# Patient Record
Sex: Female | Born: 1975 | Race: White | Hispanic: No | Smoking: Current every day smoker
Health system: Southern US, Community
[De-identification: ages and names within clinical notes are randomized; demographics above are authoritative.]

## PROBLEM LIST (undated history)

## (undated) DIAGNOSIS — K219 Gastro-esophageal reflux disease without esophagitis: Secondary | ICD-10-CM

## (undated) DIAGNOSIS — F1910 Other psychoactive substance abuse, uncomplicated: Secondary | ICD-10-CM

## (undated) HISTORY — PX: HAND SURGERY: SHX662

## (undated) HISTORY — PX: CHOLECYSTECTOMY: SHX55

## (undated) HISTORY — PX: APPENDECTOMY: SHX54

---

## 2001-02-15 ENCOUNTER — Encounter: Payer: Self-pay | Admitting: Emergency Medicine

## 2001-02-15 ENCOUNTER — Emergency Department (HOSPITAL_COMMUNITY): Admission: EM | Admit: 2001-02-15 | Discharge: 2001-02-16 | Payer: Self-pay | Admitting: Emergency Medicine

## 2001-02-20 ENCOUNTER — Emergency Department (HOSPITAL_COMMUNITY): Admission: EM | Admit: 2001-02-20 | Discharge: 2001-02-20 | Payer: Self-pay | Admitting: Emergency Medicine

## 2001-02-20 ENCOUNTER — Encounter: Payer: Self-pay | Admitting: Emergency Medicine

## 2001-03-11 ENCOUNTER — Emergency Department (HOSPITAL_COMMUNITY): Admission: EM | Admit: 2001-03-11 | Discharge: 2001-03-12 | Payer: Self-pay

## 2001-03-11 ENCOUNTER — Encounter: Payer: Self-pay | Admitting: Emergency Medicine

## 2001-11-20 ENCOUNTER — Emergency Department (HOSPITAL_COMMUNITY): Admission: EM | Admit: 2001-11-20 | Discharge: 2001-11-21 | Payer: Self-pay | Admitting: *Deleted

## 2001-11-29 ENCOUNTER — Emergency Department (HOSPITAL_COMMUNITY): Admission: EM | Admit: 2001-11-29 | Discharge: 2001-11-29 | Payer: Self-pay | Admitting: Emergency Medicine

## 2001-11-29 ENCOUNTER — Encounter: Payer: Self-pay | Admitting: Emergency Medicine

## 2001-11-30 ENCOUNTER — Emergency Department (HOSPITAL_COMMUNITY): Admission: EM | Admit: 2001-11-30 | Discharge: 2001-11-30 | Payer: Self-pay | Admitting: Emergency Medicine

## 2003-06-11 ENCOUNTER — Emergency Department (HOSPITAL_COMMUNITY): Admission: EM | Admit: 2003-06-11 | Discharge: 2003-06-11 | Payer: Self-pay

## 2003-07-11 ENCOUNTER — Emergency Department (HOSPITAL_COMMUNITY): Admission: EM | Admit: 2003-07-11 | Discharge: 2003-07-11 | Payer: Self-pay | Admitting: Emergency Medicine

## 2003-12-15 ENCOUNTER — Emergency Department (HOSPITAL_COMMUNITY): Admission: EM | Admit: 2003-12-15 | Discharge: 2003-12-15 | Payer: Self-pay | Admitting: Emergency Medicine

## 2003-12-31 ENCOUNTER — Emergency Department (HOSPITAL_COMMUNITY): Admission: EM | Admit: 2003-12-31 | Discharge: 2004-01-01 | Payer: Self-pay | Admitting: Emergency Medicine

## 2004-12-15 ENCOUNTER — Emergency Department: Payer: Self-pay | Admitting: Emergency Medicine

## 2010-04-24 ENCOUNTER — Other Ambulatory Visit: Payer: Self-pay | Admitting: Obstetrics and Gynecology

## 2010-04-24 DIAGNOSIS — N63 Unspecified lump in unspecified breast: Secondary | ICD-10-CM

## 2010-05-14 ENCOUNTER — Ambulatory Visit
Admission: RE | Admit: 2010-05-14 | Discharge: 2010-05-14 | Disposition: A | Discharge status: Home / Self Care | Payer: Self-pay | Source: Ambulatory Visit | Attending: Obstetrics and Gynecology | Admitting: Obstetrics and Gynecology

## 2010-05-14 ENCOUNTER — Other Ambulatory Visit: Payer: Self-pay | Admitting: Obstetrics and Gynecology

## 2010-05-14 DIAGNOSIS — N63 Unspecified lump in unspecified breast: Secondary | ICD-10-CM

## 2017-01-06 ENCOUNTER — Encounter (HOSPITAL_BASED_OUTPATIENT_CLINIC_OR_DEPARTMENT_OTHER): Payer: Self-pay | Admitting: Emergency Medicine

## 2017-01-06 ENCOUNTER — Emergency Department (HOSPITAL_BASED_OUTPATIENT_CLINIC_OR_DEPARTMENT_OTHER): Payer: BLUE CROSS/BLUE SHIELD

## 2017-01-06 ENCOUNTER — Other Ambulatory Visit: Payer: Self-pay

## 2017-01-06 ENCOUNTER — Emergency Department (HOSPITAL_BASED_OUTPATIENT_CLINIC_OR_DEPARTMENT_OTHER)
Admission: EM | Admit: 2017-01-06 | Discharge: 2017-01-07 | Disposition: A | Discharge status: Home / Self Care | Payer: BLUE CROSS/BLUE SHIELD | Attending: Emergency Medicine | Admitting: Emergency Medicine

## 2017-01-06 DIAGNOSIS — R079 Chest pain, unspecified: Secondary | ICD-10-CM | POA: Insufficient documentation

## 2017-01-06 DIAGNOSIS — R1084 Generalized abdominal pain: Secondary | ICD-10-CM | POA: Diagnosis not present

## 2017-01-06 DIAGNOSIS — K2900 Acute gastritis without bleeding: Secondary | ICD-10-CM | POA: Diagnosis not present

## 2017-01-06 DIAGNOSIS — N72 Inflammatory disease of cervix uteri: Secondary | ICD-10-CM | POA: Insufficient documentation

## 2017-01-06 DIAGNOSIS — Z9049 Acquired absence of other specified parts of digestive tract: Secondary | ICD-10-CM | POA: Diagnosis not present

## 2017-01-06 DIAGNOSIS — Z79899 Other long term (current) drug therapy: Secondary | ICD-10-CM | POA: Insufficient documentation

## 2017-01-06 DIAGNOSIS — F17210 Nicotine dependence, cigarettes, uncomplicated: Secondary | ICD-10-CM | POA: Insufficient documentation

## 2017-01-06 DIAGNOSIS — R0602 Shortness of breath: Secondary | ICD-10-CM | POA: Diagnosis present

## 2017-01-06 DIAGNOSIS — R102 Pelvic and perineal pain: Secondary | ICD-10-CM

## 2017-01-06 HISTORY — DX: Other psychoactive substance abuse, uncomplicated: F19.10

## 2017-01-06 HISTORY — DX: Gastro-esophageal reflux disease without esophagitis: K21.9

## 2017-01-06 LAB — COMPREHENSIVE METABOLIC PANEL
ALT: 14 U/L (ref 14–54)
AST: 18 U/L (ref 15–41)
Albumin: 3.4 g/dL — ABNORMAL LOW (ref 3.5–5.0)
Alkaline Phosphatase: 49 U/L (ref 38–126)
Anion gap: 7 (ref 5–15)
BUN: 14 mg/dL (ref 6–20)
CO2: 25 mmol/L (ref 22–32)
Calcium: 8.6 mg/dL — ABNORMAL LOW (ref 8.9–10.3)
Chloride: 101 mmol/L (ref 101–111)
Creatinine, Ser: 0.66 mg/dL (ref 0.44–1.00)
GFR calc Af Amer: >60 mL/min (ref >60)
GFR calc non Af Amer: >60 mL/min (ref >60)
Glucose, Bld: 97 mg/dL (ref 65–99)
Potassium: 3.2 mmol/L — ABNORMAL LOW (ref 3.5–5.1)
Sodium: 133 mmol/L — ABNORMAL LOW (ref 135–145)
Total Bilirubin: 0.4 mg/dL (ref 0.3–1.2)
Total Protein: 6.4 g/dL — ABNORMAL LOW (ref 6.5–8.1)

## 2017-01-06 LAB — URINALYSIS, ROUTINE W REFLEX MICROSCOPIC
Bilirubin Urine: NEGATIVE
Glucose, UA: NEGATIVE mg/dL
Hgb urine dipstick: NEGATIVE
Ketones, ur: NEGATIVE mg/dL
Leukocytes, UA: NEGATIVE
Nitrite: NEGATIVE
Protein, ur: NEGATIVE mg/dL
Specific Gravity, Urine: 1.025 (ref 1.005–1.030)
pH: 6 (ref 5.0–8.0)

## 2017-01-06 LAB — RAPID URINE DRUG SCREEN, HOSP PERFORMED
Amphetamines: NOT DETECTED
Barbiturates: NOT DETECTED
Benzodiazepines: NOT DETECTED
Cocaine: NOT DETECTED
Opiates: NOT DETECTED
Tetrahydrocannabinol: NOT DETECTED

## 2017-01-06 LAB — CBC WITH DIFFERENTIAL/PLATELET
Basophils Absolute: 0 10*3/uL (ref 0.0–0.1)
Basophils Relative: 0 %
Eosinophils Absolute: 0 10*3/uL (ref 0.0–0.7)
Eosinophils Relative: 0 %
HCT: 31.1 % — ABNORMAL LOW (ref 36.0–46.0)
Hemoglobin: 10 g/dL — ABNORMAL LOW (ref 12.0–15.0)
Lymphocytes Relative: 8 %
Lymphs Abs: 1 10*3/uL (ref 0.7–4.0)
MCH: 27.2 pg (ref 26.0–34.0)
MCHC: 32.2 g/dL (ref 30.0–36.0)
MCV: 84.7 fL (ref 78.0–100.0)
Monocytes Absolute: 0.6 10*3/uL (ref 0.1–1.0)
Monocytes Relative: 5 %
Neutro Abs: 11 10*3/uL — ABNORMAL HIGH (ref 1.7–7.7)
Neutrophils Relative %: 87 %
Platelets: 446 10*3/uL — ABNORMAL HIGH (ref 150–400)
RBC: 3.67 MIL/uL — ABNORMAL LOW (ref 3.87–5.11)
RDW: 14.9 % (ref 11.5–15.5)
WBC: 12.7 10*3/uL — ABNORMAL HIGH (ref 4.0–10.5)

## 2017-01-06 LAB — WET PREP, GENITAL
Clue Cells Wet Prep HPF POC: NONE SEEN
Sperm: NONE SEEN
Trich, Wet Prep: NONE SEEN
Yeast Wet Prep HPF POC: NONE SEEN

## 2017-01-06 LAB — PREGNANCY, URINE: Preg Test, Ur: NEGATIVE

## 2017-01-06 LAB — LIPASE, BLOOD: Lipase: 24 U/L (ref 11–51)

## 2017-01-06 MED ORDER — DOXYCYCLINE HYCLATE 100 MG PO CAPS: 100.0000 mg | ORAL_CAPSULE | Freq: Two times a day (BID) | ORAL | 0 refills | Status: AC

## 2017-01-06 MED ORDER — POLYETHYLENE GLYCOL 3350 17 G PO PACK: 17.0000 g | PACK | Freq: Every day | ORAL | 0 refills | Status: AC

## 2017-01-06 MED ORDER — PANTOPRAZOLE SODIUM 20 MG PO TBEC: 20.0000 mg | DELAYED_RELEASE_TABLET | Freq: Every day | ORAL | 0 refills | Status: AC

## 2017-01-06 MED ORDER — PANTOPRAZOLE SODIUM 40 MG IV SOLR
40.0000 mg | Freq: Once | INTRAVENOUS | Status: AC
  Administered 2017-01-06: 40 mg via INTRAVENOUS
  Filled 2017-01-06: qty 40

## 2017-01-06 MED ORDER — KETOROLAC TROMETHAMINE 30 MG/ML IJ SOLN
30.0000 mg | Freq: Once | INTRAMUSCULAR | Status: AC
  Administered 2017-01-06: 30 mg via INTRAVENOUS
  Filled 2017-01-06: qty 1

## 2017-01-06 MED ORDER — ACETAMINOPHEN 500 MG PO TABS: 1000.0000 mg | ORAL_TABLET | Freq: Four times a day (QID) | ORAL | 0 refills | Status: DC | PRN

## 2017-01-06 MED ORDER — TRAMADOL HCL 50 MG PO TABS
100.0000 mg | ORAL_TABLET | Freq: Once | ORAL | Status: AC
  Administered 2017-01-06: 100 mg via ORAL
  Filled 2017-01-06: qty 2

## 2017-01-06 MED ORDER — SODIUM CHLORIDE 0.9 % IV SOLN
Freq: Once | INTRAVENOUS | Status: AC
  Administered 2017-01-06: 19:00:00 via INTRAVENOUS

## 2017-01-06 MED ORDER — DOXYCYCLINE HYCLATE 100 MG PO TABS
100.0000 mg | ORAL_TABLET | Freq: Once | ORAL | Status: AC
  Administered 2017-01-06: 100 mg via ORAL
  Filled 2017-01-06: qty 1

## 2017-01-06 MED ORDER — IOPAMIDOL (ISOVUE-370) INJECTION 76%
100.0000 mL | Freq: Once | INTRAVENOUS | Status: AC | PRN
  Administered 2017-01-06: 100 mL via INTRAVENOUS

## 2017-01-06 MED ORDER — TRAMADOL HCL 50 MG PO TABS: 100.0000 mg | ORAL_TABLET | Freq: Four times a day (QID) | ORAL | 0 refills | Status: DC | PRN

## 2017-01-06 NOTE — ED Notes (Signed)
Pt claimed that she has pain to the middle of her shoulder blade that shoot to her bilateral ribcage.  She is a smoker.

## 2017-01-06 NOTE — ED Notes (Signed)
ED Provider at bedside. 

## 2017-01-06 NOTE — Discharge Instructions (Signed)
1.  Schedule recheck with your gynecologist and your family doctor this week. 2.  Start all medications as prescribed. 3.  Return if you develop fever, vomiting or other worsening symptoms.

## 2017-01-06 NOTE — ED Provider Notes (Signed)
[facility] EMERGENCY DEPARTMENT Provider Note   CSN: [id number] Arrival date & time: 01/06/17  1757     History   Chief Complaint Chief Complaint  Patient presents with  . Shortness of Breath    HPI Jessica Wolf is a 41 y.o. female.  HPI Reports she is having severe pain in her epigastrium and central back behind the shoulder blades.  She reports its both burning and sharp in nature.  It has been constant.  She reports she feels short of breath.  Worse with deep inspiration or exertion.  She denies she has a fever.  She reports that she has taken frequent Goody powders in the past and is concerned for possible ulcer.  Also she had bronchitis and took a prednisone Dosepak and albuterol.  She reports she had a chest x-ray done and was seen at the [facility] emergency department today.  Reports he gave her Toradol (which helped) and, reportedly told "there is nothing we can do for you".  Patient went home and her mother was still concerned for her and brought her to the urgent care who advised her to be seen at the emergency department.  Patient has had both prior appendectomy and cholecystectomy.  No known history of PE. Past Medical History:  Diagnosis Date  . Drug abuse (HCC)   . GERD (gastroesophageal reflux disease)     There are no active problems to display for this patient.   Past Surgical History:  Procedure Laterality Date  . APPENDECTOMY    . CHOLECYSTECTOMY    . HAND SURGERY Right     OB History    No data available       Home Medications    Prior to Admission medications   Medication Sig Start Date End Date Taking? Authorizing Provider  buprenorphine-naloxone (SUBOXONE) 2-0.5 mg SUBL SL tablet Place 1 tablet under the tongue daily.   Yes [provider]  acetaminophen (TYLENOL) 500 MG tablet Take 2 tablets (1,000 mg total) by mouth every 6 (six) hours as needed. 01/06/17   Arby BarrettePfeiffer, Amorina Doerr, MD  doxycycline (VIBRAMYCIN) 100 MG capsule Take  1 capsule (100 mg total) by mouth 2 (two) times daily. One po bid x 7 days 01/06/17   Arby BarrettePfeiffer, Tunis Gentle, MD  pantoprazole (PROTONIX) 20 MG tablet Take 1 tablet (20 mg total) by mouth daily. 01/06/17   Arby BarrettePfeiffer, Beckem Tomberlin, MD  polyethylene glycol (MIRALAX / GLYCOLAX) packet Take 17 g by mouth daily. 01/06/17   Arby BarrettePfeiffer, Danne Vasek, MD  traMADol (ULTRAM) 50 MG tablet Take 2 tablets (100 mg total) by mouth every 6 (six) hours as needed. 01/06/17   Arby BarrettePfeiffer, Zyhir Cappella, MD    Family History No family history on file.  Social History Social History   Tobacco Use  . Smoking status: Current Every Day Smoker    Packs/day: 0.50    Types: Cigarettes  . Smokeless tobacco: Never Used  Substance Use Topics  . Alcohol use: No    Frequency: Never  . Drug use: No     Allergies   Penicillins and Morphine and related   Review of Systems Review of Systems 10 Systems reviewed and are negative for acute change except as noted in the HPI.   Physical Exam Updated Vital Signs BP 116/71 (BP Location: Left Arm)   Pulse (!) 102   Temp 99.7 F (37.6 C) (Oral)   Resp (!) 22   Ht 5\' 5"  (1.651 m)   Wt 52.2 kg (115 lb)   LMP 12/30/2016  SpO2 95%   BMI 19.14 kg/m   Physical Exam  Constitutional: She is oriented to person, place, and time. She appears well-developed and well-nourished. No distress.  Patient appears uncomfortable.  She is alert and nontoxic.  No respiratory distress at rest.  HENT:  Head: Normocephalic and atraumatic.  Nose: Nose normal.  Mouth/Throat: Oropharynx is clear and moist.  Eyes: Conjunctivae and EOM are normal. Pupils are equal, round, and reactive to light.  Neck: Neck supple.  Cardiovascular: Regular rhythm.  No murmur heard. Borderline tachycardia.  No rub murmur gallop.  Pulmonary/Chest: Effort normal and breath sounds normal. No stridor. No respiratory distress. She has no wheezes.  Abdominal: Soft. She exhibits no distension. There is tenderness.  Patient endorses  significant tenderness in the lower abdomen as well as upper abdomen.  No peritoneal signs.  Genitourinary:  Genitourinary Comments: External female genitalia.  Speculum moderate white mucousy discharge in vault.  Cervix is not friable.  No bleeding.  Bimanual exam for moderate to severe uterine tenderness.  Bilateral adnexa are tender.  Musculoskeletal: Normal range of motion. She exhibits no edema or tenderness.  Neurological: She is alert and oriented to person, place, and time. No cranial nerve deficit. She exhibits normal muscle tone. Coordination normal.  Skin: Skin is warm and dry.  Psychiatric: She has a normal mood and affect.  Nursing note and vitals reviewed.    ED Treatments / Results  Labs (all labs ordered are listed, but only abnormal results are displayed) Labs Reviewed  WET PREP, GENITAL - Abnormal; Notable for the following components:      Result Value   WBC, Wet Prep HPF POC MODERATE (*)    All other components within normal limits  COMPREHENSIVE METABOLIC PANEL - Abnormal; Notable for the following components:   Sodium 133 (*)    Potassium 3.2 (*)    Calcium 8.6 (*)    Total Protein 6.4 (*)    Albumin 3.4 (*)    All other components within normal limits  CBC WITH DIFFERENTIAL/PLATELET - Abnormal; Notable for the following components:   WBC 12.7 (*)    RBC 3.67 (*)    Hemoglobin 10.0 (*)    HCT 31.1 (*)    Platelets 446 (*)    Neutro Abs 11.0 (*)    All other components within normal limits  URINALYSIS, ROUTINE W REFLEX MICROSCOPIC - Abnormal; Notable for the following components:   APPearance CLOUDY (*)    All other components within normal limits  LIPASE, BLOOD  RAPID URINE DRUG SCREEN, HOSP PERFORMED  PREGNANCY, URINE  RPR  HIV ANTIBODY (ROUTINE TESTING)  GC/CHLAMYDIA PROBE AMP ([place]) NOT AT [facility]    EKG  EKG Interpretation  Date/Time:  Monday January 06 2017 18:10:20 EST Ventricular Rate:  102 PR Interval:  86 QRS Duration: 90 QT  Interval:  302 QTC Calculation: 393 R Axis:   73 Text Interpretation:  Sinus tachycardia with short PR Otherwise normal ECG agree. no old Confirmed by Arby BarrettePfeiffer, Lequita Meadowcroft [phone]([id number]) on 01/06/2017 8:20:48 PM       Radiology Ct Angio Chest Pe W/cm &/or Wo Cm  Result Date: 01/06/2017 CLINICAL DATA:  Chest pain and shortness of breath.  Abdominal pain. EXAM: CT ANGIOGRAPHY CHEST CT ABDOMEN AND PELVIS WITH CONTRAST TECHNIQUE: Multidetector CT imaging of the chest was performed using the standard protocol during bolus administration of intravenous contrast. Multiplanar CT image reconstructions and MIPs were obtained to evaluate the vascular anatomy. Multidetector CT imaging of the abdomen and  pelvis was performed using the standard protocol during bolus administration of intravenous contrast. CONTRAST:  ISOVUE-370 IOPAMIDOL (ISOVUE-370) INJECTION 76% COMPARISON:  Abdominal CT 10/09/2010 FINDINGS: CTA CHEST FINDINGS Cardiovascular: There are no filling defects within the pulmonary arteries to suggest pulmonary embolus. Normal caliber thoracic aorta without dissection. The heart is normal in size. No pericardial fluid. Mediastinum/Nodes: No mediastinal or hilar adenopathy. The esophagus is decompressed. Visualized thyroid gland is normal. Lungs/Pleura: No consolidation. Minimal linear atelectasis in both lower lobes. Mild central bronchial thickening. Heterogeneity of lung parenchyma suggesting small airways disease. No pulmonary edema or pleural fluid. Trachea and non stem bronchi are patent. Musculoskeletal: There are no acute or suspicious osseous abnormalities. Review of the MIP images confirms the above findings. CT ABDOMEN and PELVIS FINDINGS Hepatobiliary: No focal hepatic lesion. Postcholecystectomy with mild intrahepatic biliary ductal dilatation. Normal postcholecystectomy common bile duct of 7 mm. Pancreas: No ductal dilatation or inflammation. Spleen: Normal in size without focal abnormality.  Adrenals/Urinary Tract: Adrenal glands are unremarkable. Kidneys are normal, without renal calculi, focal lesion, or hydronephrosis. Bladder is unremarkable. Stomach/Bowel: Stomach is within normal limits. Appendix is not visualized, surgically absent per report. No evidence of bowel wall thickening, distention, or inflammatory changes. Moderate colonic stool throughout the colon. Vascular/Lymphatic: Mild aortic atherosclerosis, advanced for age. Circumaortic left renal vein. No abdominal or pelvic adenopathy. Reproductive: Uterus unremarkable. No adnexal mass. Prominent left adnexal vascularity and dilatation of the ovarian vein measuring 8 mm. Other: Small amount of free fluid in the pelvis, both pericolic gutters and both upper quadrants, minimally complex in the right upper quadrant. No free air. No intra-abdominal abscess. Musculoskeletal: There are no acute or suspicious osseous abnormalities. Review of the MIP images confirms the above findings. IMPRESSION: CT CHEST IMPRESSION: 1. No pulmonary embolus. 2. Bronchial thickening with heterogeneous parenchymal attenuation suggesting bronchial inflammation and small vessel disease. CT ABDOMEN/PELVIS IMPRESSION: 1. Small volume of free fluid in the pelvis, both pericolic gutters and upper abdomen, right greater than left, of indeterminate etiology. Ruptured ovarian cyst with tracking fluid could have this appearance. 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 2. Mild aortic atherosclerosis, age advanced. 3. Moderate colonic stool burden, suggesting constipation. Electronically Signed   By: Rubye Oaks M.D.   On: 01/06/2017 22:07   Ct Abdomen Pelvis W Contrast  Result Date: 01/06/2017 CLINICAL DATA:  Chest pain and shortness of breath.  Abdominal pain. EXAM: CT ANGIOGRAPHY CHEST CT ABDOMEN AND PELVIS WITH CONTRAST TECHNIQUE: Multidetector CT imaging of the chest was performed using the  standard protocol during bolus administration of intravenous contrast. Multiplanar CT image reconstructions and MIPs were obtained to evaluate the vascular anatomy. Multidetector CT imaging of the abdomen and pelvis was performed using the standard protocol during bolus administration of intravenous contrast. CONTRAST:  ISOVUE-370 IOPAMIDOL (ISOVUE-370) INJECTION 76% COMPARISON:  Abdominal CT 10/09/2010 FINDINGS: CTA CHEST FINDINGS Cardiovascular: There are no filling defects within the pulmonary arteries to suggest pulmonary embolus. Normal caliber thoracic aorta without dissection. The heart is normal in size. No pericardial fluid. Mediastinum/Nodes: No mediastinal or hilar adenopathy. The esophagus is decompressed. Visualized thyroid gland is normal. Lungs/Pleura: No consolidation. Minimal linear atelectasis in both lower lobes. Mild central bronchial thickening. Heterogeneity of lung parenchyma suggesting small airways disease. No pulmonary edema or pleural fluid. Trachea and non stem bronchi are patent. Musculoskeletal: There are no acute or suspicious osseous abnormalities. Review of the MIP images confirms the above findings. CT ABDOMEN and PELVIS FINDINGS  Hepatobiliary: No focal hepatic lesion. Postcholecystectomy with mild intrahepatic biliary ductal dilatation. Normal postcholecystectomy common bile duct of 7 mm. Pancreas: No ductal dilatation or inflammation. Spleen: Normal in size without focal abnormality. Adrenals/Urinary Tract: Adrenal glands are unremarkable. Kidneys are normal, without renal calculi, focal lesion, or hydronephrosis. Bladder is unremarkable. Stomach/Bowel: Stomach is within normal limits. Appendix is not visualized, surgically absent per report. No evidence of bowel wall thickening, distention, or inflammatory changes. Moderate colonic stool throughout the colon. Vascular/Lymphatic: Mild aortic atherosclerosis, advanced for age. Circumaortic left renal vein. No abdominal or  pelvic adenopathy. Reproductive: Uterus unremarkable. No adnexal mass. Prominent left adnexal vascularity and dilatation of the ovarian vein measuring 8 mm. Other: Small amount of free fluid in the pelvis, both pericolic gutters and both upper quadrants, minimally complex in the right upper quadrant. No free air. No intra-abdominal abscess. Musculoskeletal: There are no acute or suspicious osseous abnormalities. Review of the MIP images confirms the above findings. IMPRESSION: CT CHEST IMPRESSION: 1. No pulmonary embolus. 2. Bronchial thickening with heterogeneous parenchymal attenuation suggesting bronchial inflammation and small vessel disease. CT ABDOMEN/PELVIS IMPRESSION: 1. Small volume of free fluid in the pelvis, both pericolic gutters and upper abdomen, right greater than left, of indeterminate etiology. Ruptured ovarian cyst with tracking fluid could have this appearance. 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 2. Mild aortic atherosclerosis, age advanced. 3. Moderate colonic stool burden, suggesting constipation. Electronically Signed   By: Rubye Oaks M.D.   On: 01/06/2017 22:07    Procedures Procedures (including critical care time)  Medications Ordered in ED Medications  pantoprazole (PROTONIX) injection 40 mg (40 mg Intravenous Given 01/06/17 1923)  0.9 %  sodium chloride infusion ( Intravenous New Bag/Given 01/06/17 1923)  iopamidol (ISOVUE-370) 76 % injection 100 mL (100 mLs Intravenous Contrast Given 01/06/17 2057)  ketorolac (TORADOL) 30 MG/ML injection 30 mg (30 mg Intravenous Given 01/06/17 2225)  traMADol (ULTRAM) tablet 100 mg (100 mg Oral Given 01/06/17 2318)  doxycycline (VIBRA-TABS) tablet 100 mg (100 mg Oral Given 01/06/17 2317)     Initial Impression / Assessment and Plan / ED Course  I have reviewed the triage vital signs and the nursing notes.  Pertinent labs & imaging results that were  available during my care of the patient were reviewed by me and considered in my medical decision making (see chart for details).     Final Clinical Impressions(s) / ED Diagnoses   Final diagnoses:  Generalized abdominal pain  Chest pain, unspecified type  Pelvic pain in female  Acute gastritis without hemorrhage, unspecified gastritis type  Cervicitis  Patient's pain is quite diffuse and intense.  Pain goes from epigastrium and mid chest to pelvic area.  CT scan has not identified specific etiology.  Patient does have some pelvic fluid and very tender pelvic exam.  She does not have significant discharge.  Denies sexual activity for several years or more.  At this time will treat empirically with doxycycline.  Patient reports significant penicillin allergy. Also patient identifies epigastric pain and frequent use of aspirin with concern for ulcer.  CT does not show gastric wall thickening or free air.  Patient is advised to start daily Protonix and discontinue aspirin or NSAIDs. CT also identifies constipation.  Patient will be started on MiraLAX.  Patient does report distant history of opioid use but advises at this time that she does not use any opioids and does not want to use them for pain control.  Tramadol prescribed for pain control with Tylenol to be used as needed. Patient counseled on return for fever, worsening symptoms or other concerns.   ED Discharge Orders        Ordered    doxycycline (VIBRAMYCIN) 100 MG capsule  2 times daily     01/06/17 2324    pantoprazole (PROTONIX) 20 MG tablet  Daily     01/06/17 2324    polyethylene glycol (MIRALAX / GLYCOLAX) packet  Daily     01/06/17 2324    traMADol (ULTRAM) 50 MG tablet  Every 6 hours PRN     01/06/17 2324    acetaminophen (TYLENOL) 500 MG tablet  Every 6 hours PRN     01/06/17 2324       Arby Barrette, MD 01/06/17 2338

## 2017-01-06 NOTE — ED Triage Notes (Addendum)
DX with bronchitis x 10 days ago, took prednisone dose pack and albuterol inhaler . Pain to chest and back with inspiration and worsening SOB. Was eval at ED in [place] this am and was told,"There is nothing we can do for you" Given cough med and had a chest axay done . RT in to eval in triage

## 2017-01-08 LAB — GC/CHLAMYDIA PROBE AMP (~~LOC~~) NOT AT ARMC
Chlamydia: NEGATIVE
Neisseria Gonorrhea: NEGATIVE

## 2017-01-08 LAB — HIV ANTIBODY (ROUTINE TESTING W REFLEX): HIV SCREEN 4TH GENERATION: NONREACTIVE

## 2017-01-08 LAB — RPR: RPR Ser Ql: NONREACTIVE

## 2018-07-11 IMAGING — CT CT ANGIO CHEST
3 of 12 series · 13 of 37 positions shown · IV contrast (iopamidol)
Comparison: Abdominal CT 10/09/2010

CLINICAL DATA: Chest pain and shortness of breath.  Abdominal pain.

EXAM:
CT ANGIOGRAPHY CHEST
CT ABDOMEN AND PELVIS WITH CONTRAST
TECHNIQUE: Multidetector CT imaging of the chest was performed using the
standard protocol during bolus administration of intravenous
contrast. Multiplanar CT image reconstructions and MIPs were
obtained to evaluate the vascular anatomy. Multidetector CT imaging
of the abdomen and pelvis was performed using the standard protocol
during bolus administration of intravenous contrast.
CONTRAST:  100mL 3D93ST-F4V IOPAMIDOL (3D93ST-F4V) INJECTION 76%

[Series 6: pe axial st · axial · 0.65mm/px · z∈[-117,-33]mm · 2 of 85 slices shown]
[im 29/85  lung]
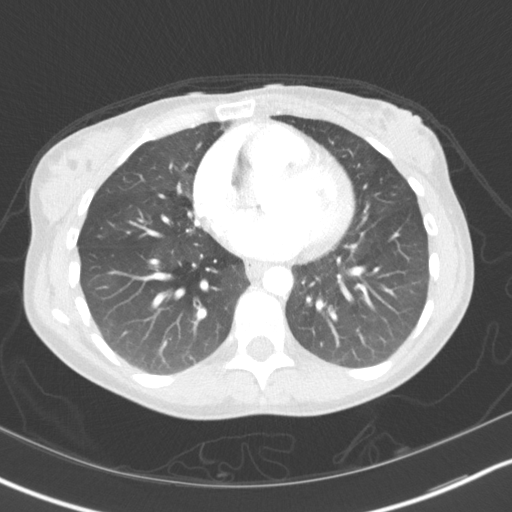
[im 57/85  lung]
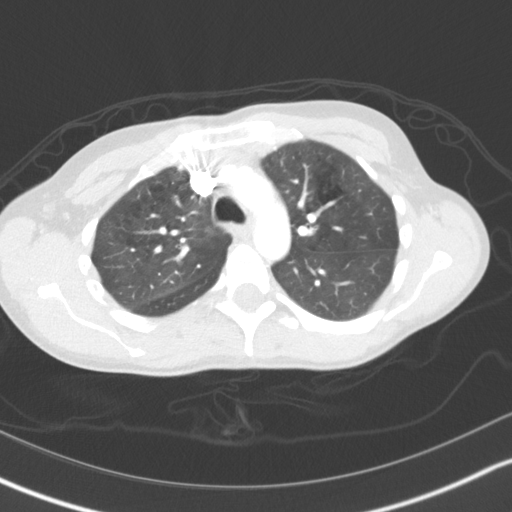

[Series 8: pe thins · axial · 0.65mm/px · z∈[-185,+34]mm · 8 of 253 slices shown]
[im 17/253  lung]
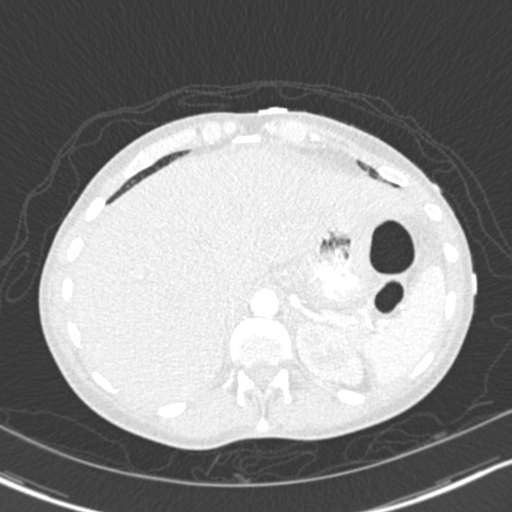
[im 51/253  lung]
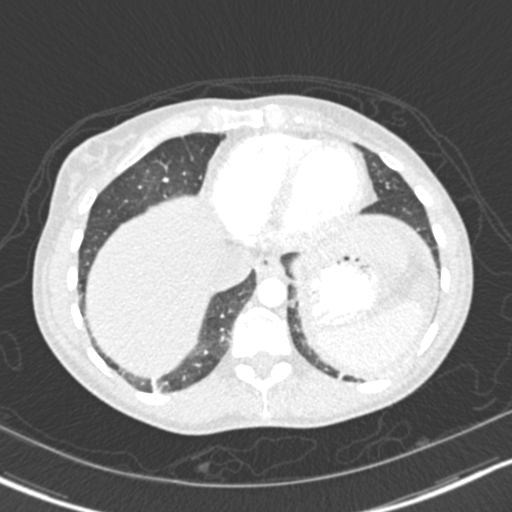
[im 85/253  lung]
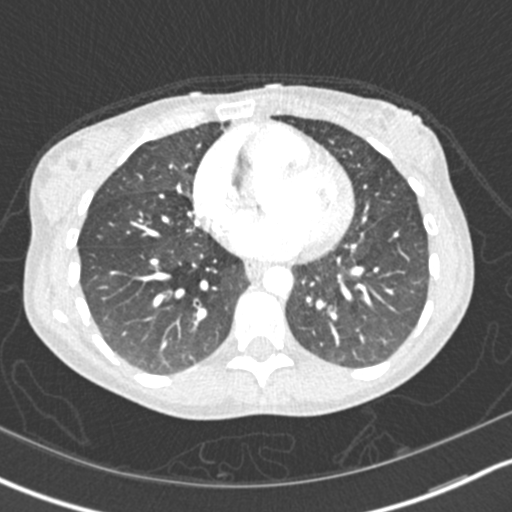
[im 118/253  lung]
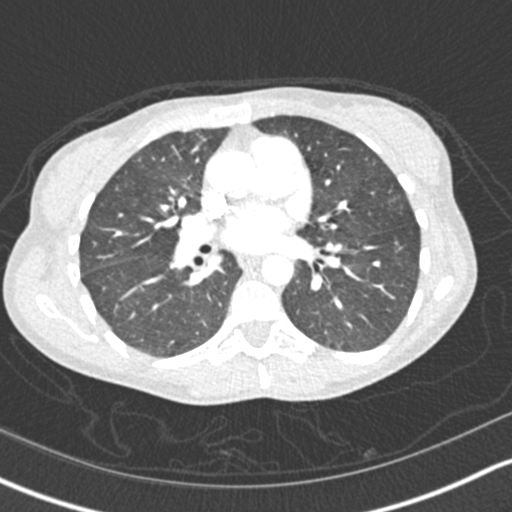
[im 135/253  lung]
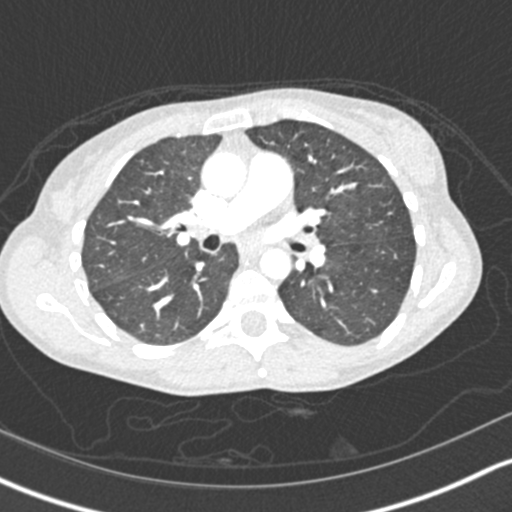
[im 169/253  lung]
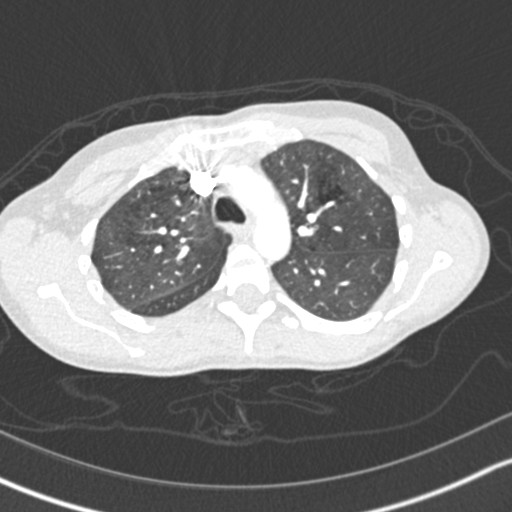
[im 202/253  lung]
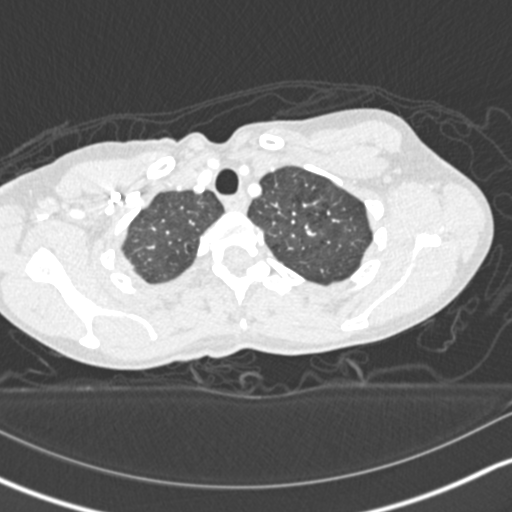
[im 236/253  lung]
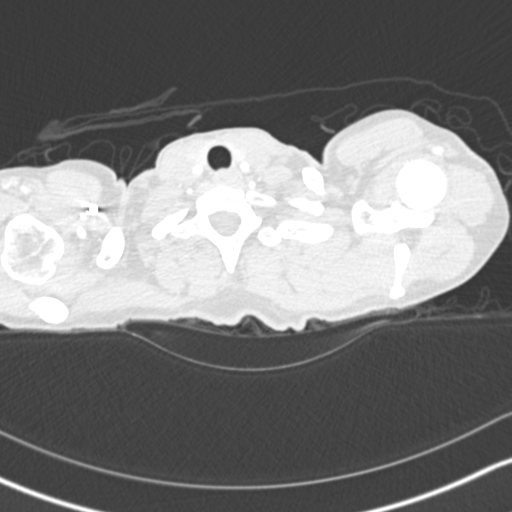

[Series 13: axial st · axial · 0.74mm/px · z∈[-451,-231]mm · 3 of 88 slices shown]
[im 22/88  lung]
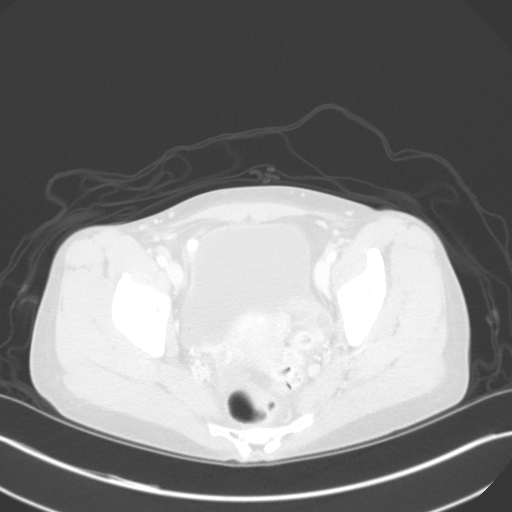
[im 44/88  mediastinal]
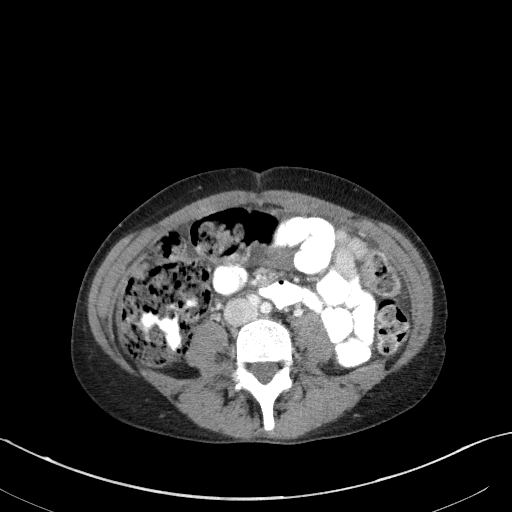
[im 66/88  lung]
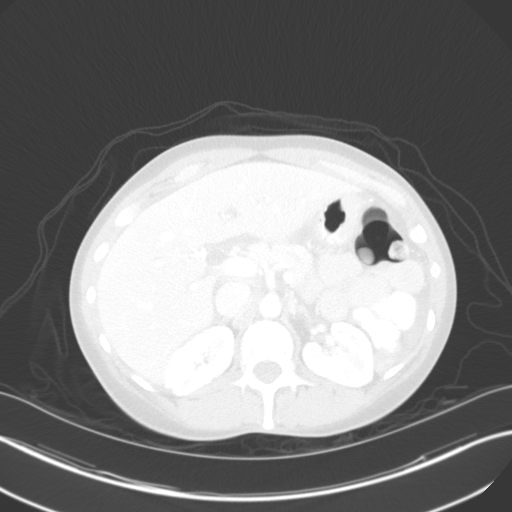

[13 of 37 positions shown; findings below may reference images not displayed]

FINDINGS: CTA CHEST FINDINGS

Cardiovascular: There are no filling defects within the pulmonary
arteries to suggest pulmonary embolus. Normal caliber thoracic aorta
without dissection. The heart is normal in size. No pericardial
fluid.

Mediastinum/Nodes: No mediastinal or hilar adenopathy. The esophagus
is decompressed. Visualized thyroid gland is normal.

Lungs/Pleura: No consolidation. Minimal linear atelectasis in both
lower lobes. Mild central bronchial thickening. Heterogeneity of
lung parenchyma suggesting small airways disease. No pulmonary edema
or pleural fluid. Trachea and non stem bronchi are patent.

Musculoskeletal: There are no acute or suspicious osseous
abnormalities.

Review of the MIP images confirms the above findings.

CT ABDOMEN and PELVIS FINDINGS

Hepatobiliary: No focal hepatic lesion. Postcholecystectomy with
mild intrahepatic biliary ductal dilatation. Normal
postcholecystectomy common bile duct of 7 mm.

Pancreas: No ductal dilatation or inflammation.

Spleen: Normal in size without focal abnormality.

Adrenals/Urinary Tract: Adrenal glands are unremarkable. Kidneys are
normal, without renal calculi, focal lesion, or hydronephrosis.
Bladder is unremarkable.

Stomach/Bowel: Stomach is within normal limits. Appendix is not
visualized, surgically absent per report. No evidence of bowel wall
thickening, distention, or inflammatory changes. Moderate colonic
stool throughout the colon.

Vascular/Lymphatic: Mild aortic atherosclerosis, advanced for age.
Circumaortic left renal vein. No abdominal or pelvic adenopathy.

Reproductive: Uterus unremarkable. No adnexal mass. Prominent left
adnexal vascularity and dilatation of the ovarian vein measuring 8
mm.

Other: Small amount of free fluid in the pelvis, both pericolic
gutters and both upper quadrants, minimally complex in the right
upper quadrant. No free air. No intra-abdominal abscess.

Musculoskeletal: There are no acute or suspicious osseous
abnormalities.

Review of the MIP images confirms the above findings.
IMPRESSION: CT CHEST IMPRESSION:

1. No pulmonary embolus.
2. Bronchial thickening with heterogeneous parenchymal attenuation
suggesting bronchial inflammation and small vessel disease.
CT ABDOMEN/PELVIS IMPRESSION:

1. Small volume of free fluid in the pelvis, both pericolic gutters
and upper abdomen, right greater than left, of indeterminate
etiology. Ruptured ovarian cyst with tracking fluid could have this
appearance. Possible pelvic congestion syndrome with prominent left
peri-uterine vascularity dilatation of the ovarian vein.
2. Mild aortic atherosclerosis, age advanced.
3. Moderate colonic stool burden, suggesting constipation.

## 2020-06-06 DIAGNOSIS — J189 Pneumonia, unspecified organism: Secondary | ICD-10-CM | POA: Insufficient documentation

## 2020-06-06 DIAGNOSIS — J441 Chronic obstructive pulmonary disease with (acute) exacerbation: Secondary | ICD-10-CM | POA: Insufficient documentation

## 2020-06-09 NOTE — Discharge Summary (Signed)
 [facility] Hospitalist Discharge Summary  Identifying Information:  Jessica Wolf 08/17/1975 [id number]  Admit date: 06/06/2020  Discharge date: 06/09/2020   Discharge Service: [facility] Hospitalist  Discharge Attending Physician:Dileep Von, MD  Discharge to: Home  Discharge Diagnoses: Principal Problem:   CAP (community acquired pneumonia) Active Problems:   Respiratory failure with hypoxia (HCC)   COPD exacerbation (HCC) Resolved Problems:   * No resolved hospital problems. [facility] Course:  Jessica Wolf a 45 y.o.femalewith PMHx COPD, opiate abuse on Suboxone, cigarette smokingas reviewed in the EMR that presented to [facility] withshortness of breath for the past 4 days and feeling chills. Patient went to urgent care today where she was found to have acute hypoxic respiratory failure and shewas sent to ER. In the ED patient was found to have 86% on room air, short of breath, tachypneic and tachycardic, CTA ruled out PE, found to have bilateral pneumonia. So patient was admitted for further management as below # Acute hypoxic respiratory failure due to COPD exacerbation secondary to pneumonia.  Patient was started on Breo Ellipta and DuoNeb every 6 hourly, patient required supplemental O2 inhalation 2 L at rest and 4 L while ablation.  Oxygen was arranged on discharge.  Patient was seen by pulmonologist, recommended to follow-up as an outpatient for PFTs and further management.  Patient was discharged on albuterol and Symbicort inhaler. # Rhinovirus infection, possible bacterial superinfection/ CAP possible bacterial superinfection. WBC count trended down to normal. S/p ceftriaxone and azithromycin IV, transition to oral antibiotics Omnicef 300 twice daily for 3 additional days.  Patient was advised to continue Mucinex twice daily and Robitussin-DM as needed.  Blood culture negative, COVID-19 negative, influenza A and B negative.  RVP positive for rhinovirus.  # Sinus tachycardia could be  secondary to hypoxic respiratory failure. D-dimer 610 elevated, CTA negative for PE. TTE shows LVEF 55 to 60%, mild to moderate pulmonary hypertension, TSH level 0.36, free T4 1.0, FT3 level 2.44 within normal range.  Follow with PCP to repeat thyroid profile after 4 weeks. Tachycardia resolved, heart rate within normal range # Hypokalemia, potassium elevated. # Hypomagnesemia, mag repleted. # Hypophosphatemia, phosphorus repleted. # Nicotine dependence, patient smokes cigarettes daily. Smoking cessation counseling done, started nicotine patch # History of opiate abuse, denies any drug abuse for the past 8 years Resumed Suboxone, UDS negative # Vitamin D deficiency, started vitamin D 50,000 units p.o. weekly.  Follow-up with PCP to repeat vitamin D level after 3 months. # iron deficiency, iron saturation 3%, started oral iron supplement.  Follow with PCP to repeat iron profile after 3 months. # Vitamin B12 deficiency, started vitamin B12 1000 mcg IM injection daily during hospital stay, start oral supplement on discharge.  Follow with PCP to repeat vitamin B12 level after 3 months.  Overall patient's condition remained stable, medically optimized and cleared for discharge home today.  Patient agreed with the discharge planning.    Post Discharge Follow Up Issues:  Follow-up with PCP in 1 week Follow with pulmonologist in 1 to 2 week for PFTs.     Procedures: No admission procedures for hospital encounter. _____________________________________________________________________________ Discharge Day Services: BP 107/77   Pulse 88   Temp 97.6 F (36.4 C) (Oral)   Resp 18   Ht 1.651 m (5' 5)   Wt 53.6 kg (118 lb 1 oz)   LMP 06/03/2020   SpO2 95%   BMI 19.65 kg/m  Pt seen on the day of discharge and determined appropriate for discharge. GEN: NAD,  lying in bed EYES: EOMI ENT: MMM CV: RRR PULM: Mild crackles bilaterally, no significant wheezing appreciated ABD: soft, NT/ND, +BS EXT:  No edema   Condition at Discharge: good  Length of Discharge: I spent 45 mins in the discharge of this patient. _____________________________________________________________________________ Discharge Medications: Patient Instructions:  Discharge Medication List as of 06/09/2020  1:17 PM    START taking these medications   Details  ascorbic acid, vitamin C, (VITAMIN C) 500 MG tablet Take 1 tablet (500 mg total) by mouth daily., Starting Sat 06/10/2020, Until Fri 09/08/2020, Normal    budesonide-formoteroL (SYMBICORT) 80-4.5 mcg/actuation inhaler Inhale 2 puffs into the lungs 2 times daily for 30 days., Starting Fri 06/09/2020, Until Sun 07/09/2020, Normal    cefdinir (OMNICEF) 300 MG capsule Take 1 capsule (300 mg total) by mouth 2 times daily for 3 days., Starting Fri 06/09/2020, Until Mon 06/12/2020, Normal    cyanocobalamin (VITAMIN B12) 1000 MCG tablet Take 1 tablet (1,000 mcg total) by mouth daily., Starting Fri 06/09/2020, Until Thu 09/07/2020, Normal    dextromethorphan-guaiFENesin (ROBITUSSIN DM) 10-100 mg/5 mL syrup Take 5 mLs by mouth every 6 (six) hours as needed., Starting Fri 06/09/2020, Normal    ergocalciferol (VITAMIN D2) 1,250 mcg (50,000 unit) capsule Take 1 capsule (50,000 Units total) by mouth once a week for 7 doses., Starting Wed 06/14/2020, Until Thu 07/27/2020, Normal    guaiFENesin (MUCINEX) 600 mg 12 hr tablet Take 1 tablet (600 mg total) by mouth every 12 hours for 5 days., Starting Fri 06/09/2020, Until Wed 06/14/2020, Normal    iron polysaccharides (NU-IRON) 150 mg iron capsule Take 1 capsule (150 mg total) by mouth daily., Starting Sat 06/10/2020, Until Fri 09/08/2020, Normal    nicotine (NICODERM CQ) 21 mg/24 hr Place 1 patch onto the skin daily., Starting Sat 06/10/2020, Normal      CONTINUE these medications which have CHANGED   Details  albuterol 90 mcg/actuation inhaler Inhale 2 puffs into the lungs every 6 (six) hours as needed for up to 30 days for Wheezing or  Shortness of Breath., Starting Fri 06/09/2020, Until Sun 07/09/2020 at 2359, Normal      CONTINUE these medications which have NOT CHANGED   Details  buprenorphine-naloxone (ZUBSOLV) 5.7-1.4 mg SL tablet Place 1 tablet under the tongue 3 times daily. (Place and dissolve under tongue), Historical Med    omeprazole (PRILOSEC) 40 MG capsule Take 40 mg by mouth daily., Historical Med      STOP taking these medications     sucralfate (CARAFATE) 1 gram tablet      fluticasone furoate-vilanteroL (BREO ELLIPTA) 100-25 mcg/dose inhaler          _____________________________________________________________________________ Pending Test Results (if blank, then none): @PRINTGROUPPENDLAB @  Most Recent Labs:  Lab Results  Component Value Date/Time   WBC 6.2 06/09/2020 0154   RBC 4.30 06/09/2020 0154   HGB 9.1 (L) 06/09/2020 0154   HCT 29.7 (L) 06/09/2020 0154   PLT 542 (H) 06/09/2020 0154    Lab Results  Component Value Date   WBC 6.2 06/09/2020   HGB 9.1 (L) 06/09/2020   HCT 29.7 (L) 06/09/2020   PLT 542 (H) 06/09/2020    Lab Results  Component Value Date   CO2 25 06/09/2020   BUN 6 (L) 06/09/2020   CREATININE 0.58 06/09/2020   CALCIUM 8.2 (L) 06/09/2020   ALBUMIN 3.9 06/06/2020   AST 14 06/06/2020   ALT 12 06/06/2020    Lab Results  Component Value Date   NA 139  06/09/2020   K 4.1 06/09/2020   CL 108 06/09/2020   CO2 25 06/09/2020   BUN 6 (L) 06/09/2020   CREATININE 0.58 06/09/2020   CALCIUM 8.2 (L) 06/09/2020   MG 1.9 06/09/2020   PHOS 3.3 06/09/2020    Lab Results  Component Value Date   ALKPHOS 70 06/06/2020   BILITOT 0.4 06/06/2020   PROT 7.8 06/06/2020   ALBUMIN 3.9 06/06/2020   ALT 12 06/06/2020   AST 14 06/06/2020    No results found for: INR, APTT Hospital Radiology:  CUS TTE SURFACE COMPLETE ECHO ADULT  Result Date: 06/07/2020                                [facility]                                Cardiac Ultrasound-                                 [place]                                [address]                                [place] [address]                   Transthoracic Echocardiogram Report Name: [place], [place]                   Study Date: 06/07/2020    Height: 65 in MRN: [id number]                         Patient Location: [facility] Weight: 111 lb DOB: Sep 25, 1975                      Gender: Female             BSA: 1.5 m2 Age: 55 yrs                          Ethnicity: W               BP: 124/75 mmHg Reason For Study: Arrhythmia; Respiratory distress; Hypoxia     HR: 80 History: Arrhythmia Ordering Physician: VON BELLIS    Performed By: MERRI BOOKBINDER Referring Physician: SELF, A REFERRAL - - PROCEDURE A two-dimensional transthoracic echocardiogram with color flow and  Doppler was performed. Image Quality: Technically adequate. - SUMMARY Left ventricular systolic function is normal. LV ejection fraction = 55-60%. There is mild mitral regurgitation. There is mild tricuspid regurgitation. Mild to moderate pulmonary hypertension. There is no pericardial effusion. There is no comparison study available. - FINDINGS: LEFT VENTRICLE The left ventricular size is normal. There is normal left ventricular wall thickness. Left ventricular systolic function is normal. LV ejection fraction = 55-60%. Left ventricular filling pattern is normal. The left ventricular wall motion is normal. - RIGHT VENTRICLE The right ventricle is normal in size and function. LEFT ATRIUM The left atrial size is normal. RIGHT ATRIUM Right atrial size is normal. - AORTIC VALVE The aortic valve is trileaflet. There is mild aortic valve thickening. There is no aortic stenosis. There is no aortic regurgitation. - MITRAL VALVE Diffuse thickening of the mitral leaflet with preserved opening. There is mild mitral  regurgitation. - TRICUSPID VALVE The tricuspid valve is thickened with normal excursion. There is mild tricuspid regurgitation. Estimated right ventricular systolic pressure is 52 mmHg. Mild to moderate pulmonary hypertension. A tricuspid valve vegetation cannot be excluded. - PULMONIC VALVE The pulmonic valve is normal in structure and function. - ARTERIES The aortic sinus is normal size. - VENOUS The IVC is _ dialted with an abnormal collapsibility index, this suggestive of increased right atrial pressure. - EFFUSION There is no pericardial effusion. There is a pleural effusion present. - - MMode/2D Measurements & Calculations IVSd: 0.91 cm      LA dim: 3.2 cm     ESV(MOD-sp4):LVOT diam: 1.8 cm LVIDd: 3.4 cm      EDV(MOD-sp4):      17.8 ml LVPWd: 0.97 cm     39.2 ml            EDV(MOD-sp2): LVIDs: 2.3 cm                         56.1 ml                                       ESV(MOD-sp2):                                       17.5 ml       _______________________________________________________________________ SV(MOD-sp4):       EF A4C: 54.6 %     LA ESV (BP): LA ESV Index (A2C): 21.4 ml                               36.9 ml      23.1 ml/m2 SI(MOD-sp4): 13.9 ml/m2       _______________________________________________________________________ LA ESV Index (A4C):LA ESV Index (BP): SV A4C: 21.4 ml/m2         24.0 ml/m2         21.4 ml Doppler Measurements & Calculations MV E max vel:   MV V2 max:       MV P1/2t max czo:DC(OCNU): 56.0 ml 96.4 cm/sec     121.0 cm/sec     124.0 cm/sec     Ao V2 max: MV A max vel:   MV max PG:       MV P1/2t:        121.0 cm/sec 90.1 cm/sec  5.9 mmHg         60.5 msec        Ao max PG: 5.9 mmHg MV E/A: 1.1     MV V2 mean:      MV dec time:     Ao V2 mean: Med Peak E' Vel:84.2 cm/sec      0.21 sec         72.5 cm/sec 10.9 cm/sec     MV mean PG:                       Ao mean PG: Lat Peak E' Vel:3.0 mmHg                          3.0 mmHg 11.2 cm/sec     MV V2 VTI:                        Ao  V2 VTI: 25.4 cm E/Lat E`: 8.6   24.4 cm E/Med E`: 8.8   MV area (1                        AVA (VTI): 2.2 cm2                 diam): 7.1 cm2                 MVA(P1/2t):                 3.6 cm2                 MVA(VTI):                 2.3 cm2       _______________________________________________________________________ LV V1 VTI:      TR max vel:      RAP systole:     AS Dimensionless 22.0 cm         331.0 cm/sec     8.0 mmHg         Index (VTI): 0.87                 TR max PG:                 43.8 mmHg                 RVSP(TR):                 51.8 mmHg       _______________________________________________________________________ AVAi(VTI)       SV index(LVOT): cm^2/m^2:                 36.3 ml/m2 1.4 cm2 _______________________________________________________________________ Reading Physician:                   MD Elspeth Kitten, [id number] 06/07/2020 10:58 AM   XR CHEST AP  PORTABLE  Result Date: 06/06/2020 CLINICAL DATA:  Shortness of breath. EXAM: PORTABLE CHEST 1 VIEW COMPARISON:  May 21, 2005. FINDINGS: The heart size and mediastinal contours are within normal limits. Both lungs are clear. The visualized skeletal structures are unremarkable. IMPRESSION: No active disease. Electronically Signed   By: Lynwood Landy Raddle M.D.   On: 06/06/2020 11:35   CTA CHEST (PE STUDY) W CONTRAST  Result Date: 06/06/2020 CLINICAL DATA:  Shortness of breath.  Elevated D-dimer. EXAM: CT ANGIOGRAPHY CHEST WITH CONTRAST TECHNIQUE: Multidetector CT  imaging of the chest was performed using the standard protocol during bolus administration of intravenous contrast. Multiplanar CT image reconstructions and MIPs were obtained to evaluate the vascular anatomy. CONTRAST:  100 cc Omnipaque 350 COMPARISON:  Chest radiography same day.  Previous CT 01/06/2017. FINDINGS: Cardiovascular: Pulmonary arterial opacification is excellent. There are no pulmonary emboli. Heart size is normal. No pericardial fluid. No aortic atherosclerotic  calcification or coronary artery calcification. Mediastinum/Nodes: No mass or lymphadenopathy. Lungs/Pleura: Widespread patchy pulmonary infiltrates consistent with widespread bronchopneumonia. No dense lobar consolidation. No collapse or effusion. Mild pre-existing emphysematous change in the upper lobes. Upper Abdomen: Normal Musculoskeletal: Normal Review of the MIP images confirms the above findings. IMPRESSION: 1. No pulmonary emboli. 2. Widespread patchy pulmonary infiltrates consistent with widespread bronchopneumonia. No dense lobar consolidation. No collapse or effusion. Electronically Signed   By: Oneil Officer M.D.   On: 06/06/2020 12:31    _____________________________________________________________________________ Discharge Instructions:   Discharge Orders and Instructions    Diet At Discharge   Complete by: As directed    Recommended diet at discharge: Regular diet   Activity At Discharge   Complete by: As directed    Recommended activity at discharge: Activity as tolerated   Contact Us  / Call Us  To Schedule An Appointment   Complete by: As directed    Call: PCP and Pulmo   Call if: Increased pain   Call if: SOB   Follow-up with PCP in 1 week Follow with pulmonologist in 1 to 2 week for PFTs.        Oxygen   Complete by: As directed    Method: Nasal Cannula   Frequency: Continuous   Length of Need: 99/Lifetime   FIO2/lpm: 4   Room Air SAT: 86   Humidification: No   Portable Tank: Standard              Electronically signed by: Elvan Sor, MD 06/09/20 1601

## 2020-12-01 DIAGNOSIS — J432 Centrilobular emphysema: Secondary | ICD-10-CM | POA: Insufficient documentation

## 2020-12-01 DIAGNOSIS — J449 Chronic obstructive pulmonary disease, unspecified: Secondary | ICD-10-CM | POA: Insufficient documentation

## 2021-03-31 NOTE — ED Provider Notes (Signed)
 Emergency Department Provider Note  Dragon voice dictation used for charting.    Provider at bedside: 7:09 AM  History obtained from the: Patient  History   Chief Complaint  Patient presents with  . Shortness of Breath     HPI  Jessica Wolf is a 46 y.o. female with a PMH of COPD who presents to the ED with complaints of shortness of breath.  The patient states that due to financial reasons, she has not been able to refill her inhaler prescriptions and only has albuterol at home.  For the last month and a half, she has only been using the albuterol for the most part.  Over the last 3 days, she notes that her shortness of breath has worsened.  She also reports increased sputum production.  She denies any fevers or chest pain.      No LMP recorded.   Past Medical History Past Medical History:  Diagnosis Date  . COPD (chronic obstructive pulmonary disease) (HCC)   . Pneumonia     Past Surgical History History reviewed. No pertinent surgical history.   Medications Home Medications  Medication Sig Dispense Refill  . albuterol (ACCUNEB) 1.25 mg/3 mL nebulizer solution Inhale 3 mLs (1.25 mg total) by nebulization every 6 (six) hours as needed for up to 30 days for Wheezing. 180 mL 11  . albuterol 90 mcg/actuation inhaler Inhale 2 puffs into the lungs every 6 (six) hours as needed for up to 30 days for Wheezing or Shortness of Breath. 1 each 1  . ascorbic acid, vitamin C, (VITAMIN C) 500 MG tablet Take 1 tablet (500 mg total) by mouth daily. 30 tablet 2  . azithromycin (ZITHROMAX) 250 MG tablet Take first 2 tablets together, then 1 every day until finished. 6 tablet 0  . budesonide-formoteroL (SYMBICORT) 160-4.5 mcg/actuation inhaler Inhale 2 puffs into the lungs 2 times daily for 30 days. 1 each 11  . buprenorphine-naloxone (ZUBSOLV) 5.7-1.4 mg SL tablet Place 1 tablet under the tongue 3 times daily. (Place and dissolve under tongue)    . cyanocobalamin (VITAMIN B12) 1000 MCG  tablet Take 1 tablet (1,000 mcg total) by mouth daily. 30 tablet 2  . ergocalciferol (VITAMIN D2) 1,250 mcg (50,000 unit) capsule Take 1 capsule (50,000 Units total) by mouth once a week for 7 doses. 7 capsule 0  . guaiFENesin (MUCINEX) 600 mg 12 hr tablet Take 1 tablet (600 mg total) by mouth every 12 hours for 5 days. 10 tablet 0  . iron polysaccharides (NU-IRON) 150 mg iron capsule Take 1 capsule (150 mg total) by mouth daily. 30 capsule 2  . omeprazole (PRILOSEC) 40 MG capsule Take 40 mg by mouth daily.    . predniSONE (DELTASONE) 10 mg tablet pack Take by mouth See Admin Instructions for 6 doses. Take as directed on dose pack for 6 days. 1 each 0  . SPIRIVA WITH HANDIHALER 18 mcg inhalation capsule Place 1 capsule into inhaler and inhale daily. 30 capsule 11  . sucralfate (CARAFATE) 1 gram tablet         Allergies Allergies  Allergen Reactions  . Penicillins Rash (ALLERGY/intolerance)  . Morphine Rash (ALLERGY/intolerance)     Family History History reviewed. No pertinent family history.   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  Respiratory: Positive for cough and shortness of breath.   Cardiovascular: Negative for chest pain and leg swelling.  Gastrointestinal: Negative for abdominal pain, nausea and  vomiting.  Skin: Negative for rash.  Allergic/Immunologic: Negative for immunocompromised state.  Hematological: Does not bruise/bleed easily.     Physical Exam   Vitals:   03/31/21 0639 03/31/21 0730 03/31/21 0813 03/31/21 0845  BP: 138/83 112/76  113/62  Pulse: 85 75 78 76  Temp: 98.5 F (36.9 C)     Resp: (!) 26 15 20 14   SpO2: 94% 94% 100% 95%  PainSc: 3-Three (mild)       Physical Exam Vitals and nursing note reviewed.  Constitutional:      General: She is not in acute distress.    Appearance: She is well-developed. She is not toxic-appearing.  HENT:     Head: Normocephalic and  atraumatic.     Right Ear: External ear normal.     Left Ear: External ear normal.     Nose: Nose normal.  Eyes:     Conjunctiva/sclera: Conjunctivae normal.  Cardiovascular:     Rate and Rhythm: Normal rate and regular rhythm.     Pulses: Normal pulses.     Heart sounds: Normal heart sounds.  Pulmonary:     Effort: Pulmonary effort is normal. Tachypnea present. No respiratory distress.     Breath sounds: Decreased breath sounds and wheezing present.  Skin:    General: Skin is warm.     Capillary Refill: Capillary refill takes 2 to 3 seconds.  Neurological:     Mental Status: She is alert.  Psychiatric:        Mood and Affect: Mood normal.        Speech: Speech normal.        Behavior: Behavior normal.     Labs   Recent Results (from the past 72 hour(s))  CBC and Differential  Result Value Ref Range   WBC 4.8 4.4 - 11.0 x 10*3/uL   RBC 4.76 4.10 - 5.10 x 10*6/uL   Hemoglobin 11.8 (L) 12.3 - 15.3 G/DL   Hematocrit 63.1 64.0 - 44.6 %   MCV 77.3 (L) 80.0 - 96.0 FL   MCH 24.9 (L) 27.5 - 33.2 PG   MCHC 32.2 (L) 33.0 - 37.0 G/DL   RDW 81.1 (H) 87.6 - 82.9 %   Platelets 415 150 - 450 X 10*3/uL   MPV 7.3 6.8 - 10.2 FL   Neutrophil % 42 %   Lymphocyte % 44 %   Monocyte % 11 %   Eosinophil % 2 %   Basophil % 2 %   Neutrophil Absolute 2.0 1.8 - 7.8 x 10*3/uL   Lymphocyte Absolute 2.1 1.0 - 4.8 x 10*3/uL   Monocyte Absolute 0.5 0.0 - 0.8 x 10*3/uL   Eosinophil Absolute 0.1 0.0 - 0.5 x 10*3/uL   Basophil Absolute 0.1 0.0 - 0.2 x 10*3/uL  Troponin I  Result Value Ref Range   Troponin I 3 <18 pg/mL  Comprehensive Metabolic Panel  Result Value Ref Range   Sodium 138 135 - 146 MMOL/L   Potassium 4.0 3.5 - 5.3 MMOL/L   Chloride 102 98 - 110 MMOL/L   CO2 28 23 - 30 MMOL/L   BUN 12 8 - 24 MG/DL   Glucose 81 70 - 99 MG/DL   Creatinine 9.11 9.49 - 1.50 MG/DL   Calcium 89.6 8.5 - 89.4 MG/DL   Total Protein 7.5 6.0 - 8.3 G/DL   Albumin  4.2 3.5 - 5.0 G/DL   Total Bilirubin  0.2 0.1 - 1.2 MG/DL   Alkaline Phosphatase 61 25 - 125 IU/L or  U/L   AST (SGOT) 20 5 - 40 IU/L or U/L   ALT (SGPT) 17 5 - 50 IU/L or U/L   Anion Gap 8 4 - 14 MMOL/L   Est. GFR 84 >=60 ML/MIN/1.73 M*2   CBC and Differential   Narrative   The following orders were created for panel order CBC and Differential. Procedure                               Abnormality         Status                    ---------                               -----------         ------                    CBC and Differential[[id number]]         Abnormal            Final result               Please view results for these tests on the individual orders.  Extra Tube Draw   Narrative   The following orders were created for panel order Extra Tube Draw. Procedure                               Abnormality         Status                    ---------                               -----------         ------                    Hnoi[[id number]]                                             In process                Light Blue[[id number]]                                       In process                 Please view results for these tests on the individual orders.  COVID-19 and Influenza A/B PCR (Cepheid)   Specimen: Nasal; Nasopharyngeal Swab  Result Value Ref Range   SARS-COV-2 Negative Negative   SARS-COV-2 COMMENT      Positive results are indicative of active infection with SARS-CoV-2; clinical correlation with patient history and other diagnostic information is necessary to determine patient infection status.  Negative results do not preclude SARS-CoV-2 infection and should not be used as the sole basis for treatment or other patient management decisions.   SARS-COV-2 METHOD      This test was performed on the [employer] instrument at  [facility].  The Xpress SARS-CoV-2 test is only for use under the Food and Drug Administration's Emergency Use Authorization.   Influenza A Negative Negative   Influenza B Negative  Negative     Radiology   Results for orders placed or performed during the hospital encounter of 03/31/21  XR CHEST AP  PORTABLE   Narrative   CLINICAL DATA:  Shortness of breath and cough.  EXAM: PORTABLE CHEST 1 VIEW  COMPARISON:  12/01/2020  FINDINGS: There is an indeterminate linear radio opacity overlying the right lower lung. This is of uncertain clinical significance. I suspect this is external to the patient but cannot confirm based on AP projection radiograph alone.  Heart size appears normal. No pleural effusion or edema. No airspace opacities identified. There are diffuse coarsened interstitial markings identified bilaterally compatible with chronic bronchitic change. The visualized osseous structures are unremarkable.  IMPRESSION: 1. Indeterminate linear opacity overlying the right lower lung. I suspect this is external to the patient but cannot confirm based on AP projection radiograph alone. Recommend correlation with physical exam findings. If after physical inspection this remains indeterminate a lateral radiograph may be helpful to further localize this indeterminate structure. 2. Chronic bronchitic changes.   Electronically Signed   By: Waddell Calk M.D.   On: 03/31/2021 07:51     EKG   0 Result Notes   Narrative & Impression  Ventricular Rate                   78        BPM                  Atrial Rate                        78        BPM                  P-R Interval                       120       ms                   QRS Duration                       90        ms                   Q-T Interval                       358       ms                   QTC                                408       ms                   P Axis                             76        degrees              R Axis  78        degrees              T Axis                             66        degrees               Sinus rhythm  Normal  ECG  When compared with ECG of 06-Jun-2020 10:18,  Vent. rate has decreased BY  49 BPM  ST no longer depressed in inferior leads  Nonspecific T wave abnormality no longer evident in inferior leads           ED Course   ED Course as of 03/31/21 1240  Sat Mar 31, 2021  0825 Upon reassessment, the patient's air movement has improved and her wheezing has resolved.  The chest x-ray reading showed a linear opacity overlying the right base.  I did confirm that this was an external artifact from the patient's underwire bra [AS]  1237 CBC for leukocytosis, leukopenia, anemia, thrombocytopenia   CMP to assess for hyponatremia, hypernatremia, hypokalemia, hyperkalemia, acidosis, acute kidney injury, hyperglycemia, liver function  EKG for acute MI, arrhythmia, conduction abnormality, ischemia, electrolyte abnormality.  Chest xray for pneumonia, pneumothorax, pleural effusion, pulmonary edema, cardiomegaly, aortic abnormality.   [AS]    ED Course User Index [AS] Allyson J Schlagheck, PA-C    Procedure Note   Procedures  Medical Decision Making   Clinical Complexity  Patient's presentation is most consistent with acute presentation with potential threat to life or bodily function. .  Patient's Financial difficulties increases the complexity of managing their  presentation with ensuring patient has access to necessary medications after discharge  Provider time spent in patient care today, inclusive of but not limited to clinical reassessment, review of diagnostic studies, and discharge preparation, was greater than 30 minutes.    Medical Decision Making COPD with acute exacerbation [facility]): acute illness or injury Amount and/or Complexity of Data Reviewed Labs: ordered. Decision-making details documented in ED Course. Radiology: ordered and independent interpretation performed. Decision-making details documented in ED Course. ECG/medicine tests: ordered and independent  interpretation performed. Decision-making details documented in ED Course.   Risk Prescription drug management.     DDX: MI, angina, pneumonia, PE, pneumothorax, musculoskeletal pain, cardiomyopathy, bronchitis, COPD, CHF, dyspepsia, gastric ulcer disease, esophagitis, plueural effusion   ED Clinical Impression   1. COPD with acute exacerbation [facility])      ED Assessment/Plan  Mrs. Mitcheltree presented to the ED with a chief complaint of shortness of breath.  Upon arrival to patient, she was resting on the hospital bed in no acute distress.  Physical exam was remarkable for the above findings.  While in the ED, the patient was given a dose of IV Solu-Medrol as well as 3 DuoNeb treatments.  Later, she was also given a dose of albuterol via MDI. CBC was markable for a microcytic anemia of 11.8 which appears to be an improvement from previous levels.  Troponin was not elevated.  COVID and flu test were negative.  EKG showed normal sinus rhythm.  Chest x-ray showed chronic bronchitic changes as well as a linear opacity overlying the right base.  I did confirm that this was the patient's underwire from her bra. Upon reassessment, the patient's wheezing had resolved and her air movement had greatly improved.  She also stated that she was feeling much better.  I discussed speaking with social work to see if she qualified for any sort of charity care to assist with her medication/financial difficulties, but she stated that she knew that she did not qualify and did not wish to speak with social work.  Strict return precautions were discussed and she was given the rest of her discharge instructions.  She stated that she understood and had no further questions.  She was discharged in stable condition with prescriptions for prednisone and azithromycin.  ED Meds Given During Visit Medications  methylPREDNISolone sodium succinate (Solu-MEDROL) injection 125 mg (125 mg Intravenous Given 03/31/21 0737)   ipratropium 0.5 mg + albuterol 2.5 mg nebulizer solution (DuoNeb equivalent) ( Nebulization Given 03/31/21 0813)  albuterol 90 mcg/actuation inhaler 2 puff (2 puffs Inhalation Given 03/31/21 0901)      Discharge Medication List as of 03/31/2021  8:28 AM        FOLLOW UP Kip Dale Horseman, MD [address] [place] [address] [phone]   As needed  Emergency - [facility], [facility] [address] [place]  [address] [phone]  As needed   Electronically signed by: 7:09 AM 03/31/2021 for [facility] Susoni Schlagheck PA-C    Electronically signed by: Peyton PARAS Schlagheck, PA-C 03/31/21 1240

## 2024-01-01 ENCOUNTER — Emergency Department (HOSPITAL_BASED_OUTPATIENT_CLINIC_OR_DEPARTMENT_OTHER): Payer: PRIVATE HEALTH INSURANCE

## 2024-01-01 ENCOUNTER — Encounter (HOSPITAL_BASED_OUTPATIENT_CLINIC_OR_DEPARTMENT_OTHER): Payer: Self-pay | Admitting: Emergency Medicine

## 2024-01-01 ENCOUNTER — Inpatient Hospital Stay (HOSPITAL_BASED_OUTPATIENT_CLINIC_OR_DEPARTMENT_OTHER)
Admission: EM | Admit: 2024-01-01 | Discharge: 2024-01-05 | DRG: 200 | Disposition: A | Discharge status: Home / Self Care | Payer: PRIVATE HEALTH INSURANCE | Attending: Internal Medicine | Admitting: Internal Medicine

## 2024-01-01 ENCOUNTER — Other Ambulatory Visit: Payer: Self-pay

## 2024-01-01 DIAGNOSIS — J441 Chronic obstructive pulmonary disease with (acute) exacerbation: Secondary | ICD-10-CM | POA: Diagnosis present

## 2024-01-01 DIAGNOSIS — K219 Gastro-esophageal reflux disease without esophagitis: Secondary | ICD-10-CM | POA: Diagnosis present

## 2024-01-01 DIAGNOSIS — Z79899 Other long term (current) drug therapy: Secondary | ICD-10-CM

## 2024-01-01 DIAGNOSIS — J439 Emphysema, unspecified: Secondary | ICD-10-CM | POA: Diagnosis present

## 2024-01-01 DIAGNOSIS — F17210 Nicotine dependence, cigarettes, uncomplicated: Secondary | ICD-10-CM | POA: Diagnosis present

## 2024-01-01 DIAGNOSIS — Z885 Allergy status to narcotic agent status: Secondary | ICD-10-CM

## 2024-01-01 DIAGNOSIS — J939 Pneumothorax, unspecified: Principal | ICD-10-CM | POA: Diagnosis present

## 2024-01-01 DIAGNOSIS — J9383 Other pneumothorax: Secondary | ICD-10-CM | POA: Diagnosis not present

## 2024-01-01 DIAGNOSIS — Z880 Allergy status to penicillin: Secondary | ICD-10-CM

## 2024-01-01 DIAGNOSIS — Z1152 Encounter for screening for COVID-19: Secondary | ICD-10-CM

## 2024-01-01 DIAGNOSIS — Z79891 Long term (current) use of opiate analgesic: Secondary | ICD-10-CM

## 2024-01-01 DIAGNOSIS — J9811 Atelectasis: Secondary | ICD-10-CM | POA: Diagnosis present

## 2024-01-01 LAB — CBC
HCT: 37.7 % (ref 36.0–46.0)
Hemoglobin: 12.8 g/dL (ref 12.0–15.0)
MCH: 30.2 pg (ref 26.0–34.0)
MCHC: 34 g/dL (ref 30.0–36.0)
MCV: 88.9 fL (ref 80.0–100.0)
Platelets: 432 K/uL — ABNORMAL HIGH (ref 150–400)
RBC: 4.24 MIL/uL (ref 3.87–5.11)
RDW: 14.3 % (ref 11.5–15.5)
WBC: 9.9 K/uL (ref 4.0–10.5)
nRBC: 0 % (ref 0.0–0.2)

## 2024-01-01 LAB — BASIC METABOLIC PANEL WITH GFR
Anion gap: 10 (ref 5–15)
BUN: 25 mg/dL — ABNORMAL HIGH (ref 6–20)
CO2: 27 mmol/L (ref 22–32)
Calcium: 9.4 mg/dL (ref 8.9–10.3)
Chloride: 99 mmol/L (ref 98–111)
Creatinine, Ser: 0.75 mg/dL (ref 0.44–1.00)
GFR, Estimated: >60 mL/min (ref >60)
Glucose, Bld: 98 mg/dL (ref 70–99)
Potassium: 3.9 mmol/L (ref 3.5–5.1)
Sodium: 137 mmol/L (ref 135–145)

## 2024-01-01 LAB — PROTIME-INR
INR: 0.9 (ref 0.8–1.2)
Prothrombin Time: 13.1 s (ref 11.4–15.2)

## 2024-01-01 LAB — RESP PANEL BY RT-PCR (RSV, FLU A&B, COVID)  RVPGX2
Influenza A by PCR: NEGATIVE
Influenza B by PCR: NEGATIVE
Resp Syncytial Virus by PCR: NEGATIVE
SARS Coronavirus 2 by RT PCR: NEGATIVE

## 2024-01-01 LAB — TROPONIN T, HIGH SENSITIVITY: Troponin T High Sensitivity: <15 ng/L (ref 0–19)

## 2024-01-01 LAB — HCG, SERUM, QUALITATIVE: Preg, Serum: NEGATIVE

## 2024-01-01 MED ORDER — ALBUTEROL SULFATE (2.5 MG/3ML) 0.083% IN NEBU
5.0000 mg | INHALATION_SOLUTION | Freq: Once | RESPIRATORY_TRACT | Status: AC
  Administered 2024-01-01: 5 mg via RESPIRATORY_TRACT
  Filled 2024-01-01: qty 6

## 2024-01-01 MED ORDER — METHYLPREDNISOLONE SODIUM SUCC 125 MG IJ SOLR
125.0000 mg | Freq: Once | INTRAMUSCULAR | Status: AC
  Administered 2024-01-01: 125 mg via INTRAVENOUS
  Filled 2024-01-01: qty 2

## 2024-01-01 MED ORDER — DIAZEPAM 5 MG/ML IJ SOLN
5.0000 mg | INTRAMUSCULAR | Status: DC | PRN
  Administered 2024-01-02 – 2024-01-03 (×3): 5 mg via INTRAVENOUS
  Filled 2024-01-01 (×3): qty 2

## 2024-01-01 MED ORDER — IPRATROPIUM BROMIDE 0.02 % IN SOLN
0.5000 mg | Freq: Once | RESPIRATORY_TRACT | Status: AC
  Administered 2024-01-01: 0.5 mg via RESPIRATORY_TRACT
  Filled 2024-01-01: qty 2.5

## 2024-01-01 MED ORDER — LORAZEPAM 2 MG/ML IJ SOLN
1.0000 mg | Freq: Once | INTRAMUSCULAR | Status: AC
  Administered 2024-01-01: 1 mg via INTRAVENOUS
  Filled 2024-01-01: qty 1

## 2024-01-01 MED ORDER — HYDROMORPHONE HCL 1 MG/ML IJ SOLN
1.0000 mg | Freq: Once | INTRAMUSCULAR | Status: AC
  Administered 2024-01-01: 1 mg via INTRAVENOUS
  Filled 2024-01-01: qty 1

## 2024-01-01 MED ORDER — LIDOCAINE HCL 2 % IJ SOLN
10.0000 mL | Freq: Once | INTRAMUSCULAR | Status: AC
  Administered 2024-01-01: 200 mg
  Filled 2024-01-01: qty 20

## 2024-01-01 MED ORDER — IOHEXOL 350 MG/ML SOLN
75.0000 mL | Freq: Once | INTRAVENOUS | Status: AC | PRN
  Administered 2024-01-01: 75 mL via INTRAVENOUS

## 2024-01-01 MED ORDER — HYDROMORPHONE HCL 1 MG/ML IJ SOLN
1.0000 mg | INTRAMUSCULAR | Status: DC | PRN
  Administered 2024-01-02 (×3): 1 mg via INTRAVENOUS
  Filled 2024-01-01 (×3): qty 1

## 2024-01-01 MED ORDER — ALBUTEROL SULFATE (2.5 MG/3ML) 0.083% IN NEBU: 5.0000 mg | INHALATION_SOLUTION | RESPIRATORY_TRACT | Status: DC | PRN

## 2024-01-01 MED ORDER — DIAZEPAM 5 MG/ML IJ SOLN
5.0000 mg | Freq: Once | INTRAMUSCULAR | Status: AC
  Administered 2024-01-01: 5 mg via INTRAVENOUS
  Filled 2024-01-01: qty 2

## 2024-01-01 MED ORDER — KETAMINE HCL 10 MG/ML IJ SOLN
0.3000 mg/kg | Freq: Once | INTRAMUSCULAR | Status: AC
  Administered 2024-01-01: 15 mg via INTRAVENOUS
  Filled 2024-01-01: qty 1

## 2024-01-01 NOTE — ED Notes (Signed)
 Patient transported to CT

## 2024-01-01 NOTE — ED Notes (Signed)
 MD communicated that post breathing treatments he would like to trial the pt on a NRB for comfort.  I notified RT of this request, same will manage o2 requirements.

## 2024-01-01 NOTE — ED Notes (Signed)
 CT called with update of benedryl admin to acquire pt in timely window.

## 2024-01-01 NOTE — ED Provider Notes (Signed)
 [place] [facility] Provider Note   CSN: [id number] Arrival date & time: 01/01/24  1958     Patient presents with: Shortness of Breath   Jessica Wolf is a 48 y.o. female history of COPD here presenting with shortness of breath.  Patient went to urgent care for shortness of breath 2 days ago.  Patient was treated with doxycycline  and had an x-ray that reportedly were normal.  However radiology over read the x-ray today and it showed a pneumothorax.  Patient was sent here for CT scan and potential chest tube.  Patient states that she has COPD and uses oxygen as needed.  She denies any fall or trauma   The history is provided by the patient.       Prior to Admission medications   Medication Sig Start Date End Date Taking? Authorizing Provider  acetaminophen  (TYLENOL ) 500 MG tablet Take 2 tablets (1,000 mg total) by mouth every 6 (six) hours as needed. 01/06/17   Armenta Canning, MD  buprenorphine-naloxone (SUBOXONE) 2-0.5 mg SUBL SL tablet Place 1 tablet under the tongue daily.    [provider]  doxycycline  (VIBRAMYCIN ) 100 MG capsule Take 1 capsule (100 mg total) by mouth 2 (two) times daily. One po bid x 7 days 01/06/17   Armenta Canning, MD  pantoprazole  (PROTONIX ) 20 MG tablet Take 1 tablet (20 mg total) by mouth daily. 01/06/17   Armenta Canning, MD  polyethylene glycol (MIRALAX  / GLYCOLAX ) packet Take 17 g by mouth daily. 01/06/17   Armenta Canning, MD  traMADol  (ULTRAM ) 50 MG tablet Take 2 tablets (100 mg total) by mouth every 6 (six) hours as needed. 01/06/17   Armenta Canning, MD    Allergies: Penicillins and Morphine and codeine    Review of Systems  Respiratory:  Positive for shortness of breath.   All other systems reviewed and are negative.   Updated Vital Signs BP 101/71 (BP Location: Left Arm)   Pulse 82   Temp 98.3 F (36.8 C)   Resp 16   Wt 51.3 kg   SpO2 100%   BMI 18.80 kg/m   Physical Exam Vitals and  nursing note reviewed.  Constitutional:      Comments: Tachypneic  HENT:     Head: Normocephalic.     Mouth/Throat:     Mouth: Mucous membranes are moist.  Eyes:     Extraocular Movements: Extraocular movements intact.     Pupils: Pupils are equal, round, and reactive to light.  Cardiovascular:     Rate and Rhythm: Regular rhythm. Tachycardia present.  Pulmonary:     Comments: No breath sounds in the left side.  Patient has wheezing on the right side Musculoskeletal:        General: Normal range of motion.     Cervical back: Normal range of motion and neck supple.  Skin:    General: Skin is warm.     Capillary Refill: Capillary refill takes less than 2 seconds.  Neurological:     General: No focal deficit present.     Mental Status: She is oriented to person, place, and time.  Psychiatric:        Mood and Affect: Mood normal.        Behavior: Behavior normal.     (all labs ordered are listed, but only abnormal results are displayed) Labs Reviewed  BASIC METABOLIC PANEL WITH GFR - Abnormal; Notable for the following components:      Result Value  BUN 25 (*)    All other components within normal limits  CBC - Abnormal; Notable for the following components:   Platelets 432 (*)    All other components within normal limits  RESP PANEL BY RT-PCR (RSV, FLU A&B, COVID)  RVPGX2  HCG, SERUM, QUALITATIVE  PROTIME-INR  TROPONIN T, HIGH SENSITIVITY  TROPONIN T, HIGH SENSITIVITY    EKG: None  Radiology: CT Angio Chest PE W and/or Wo Contrast Result Date: 01/01/2024 CLINICAL DATA:  High probability for PE.  Pneumothorax. EXAM: CT ANGIOGRAPHY CHEST WITH CONTRAST TECHNIQUE: Multidetector CT imaging of the chest was performed using the standard protocol during bolus administration of intravenous contrast. Multiplanar CT image reconstructions and MIPs were obtained to evaluate the vascular anatomy. RADIATION DOSE REDUCTION: This exam was performed according to the departmental  dose-optimization program which includes automated exposure control, adjustment of the mA and/or kV according to patient size and/or use of iterative reconstruction technique. CONTRAST:  75mL OMNIPAQUE IOHEXOL 350 MG/ML SOLN COMPARISON:  Chest x-ray same day.  CT chest 01/06/2017. FINDINGS: Cardiovascular: Satisfactory opacification of the pulmonary arteries to the segmental level. No evidence of pulmonary embolism. Normal heart size. No pericardial effusion. Mediastinum/Nodes: No enlarged mediastinal, hilar, or axillary lymph nodes. Thyroid gland, trachea, and esophagus demonstrate no significant findings. Lungs/Pleura: Moderate-sized left pneumothorax is present (proximally 40%. There is atelectasis of the left lower lobe and lingula. Mild emphysema present throughout both lungs. No other focal lung infiltrate or pleural effusion. Upper Abdomen: No acute abnormality.  Gallbladder surgically absent. Musculoskeletal: No chest wall abnormality. No acute or significant osseous findings. Review of the MIP images confirms the above findings. IMPRESSION: 1. No evidence for pulmonary embolism. 2. Moderate-sized left pneumothorax (proximally 40%). 3. Atelectasis of the left lower lobe and lingula. 4. Emphysema. Emphysema (ICD10-J43.9). Electronically Signed   By: Greig Pique M.D.   On: 01/01/2024 22:56   DG Chest 2 View Result Date: 01/01/2024 CLINICAL DATA:  Shortness of breath. EXAM: DG CHEST 2V COMPARISON:  Chest radiograph dated 03/31/2021. FINDINGS: Background of emphysema. There is a moderate size left pneumothorax, proximally 20%. No pleural effusion. The cardiac silhouette is within normal limits. No acute osseous pathology. IMPRESSION: Moderate size left pneumothorax. These results were called by telephone at the time of interpretation on 01/01/2024 at 9:05 pm to provider Darell Saputo , who verbally acknowledged these results. Electronically Signed   By: Vanetta Chou M.D.   On: 01/01/2024 21:16      Procedures   CRITICAL CARE Performed by: Alm VEAR Cave   Total critical care time: 45 minutes  Critical care time was exclusive of separately billable procedures and treating other patients.  Critical care was necessary to treat or prevent imminent or life-threatening deterioration.  Critical care was time spent personally by me on the following activities: development of treatment plan with patient and/or surrogate as well as nursing, discussions with consultants, evaluation of patient's response to treatment, examination of patient, obtaining history from patient or surrogate, ordering and performing treatments and interventions, ordering and review of laboratory studies, ordering and review of radiographic studies, pulse oximetry and re-evaluation of patient's condition.  CHEST TUBE INSERTION Date/Time: 01/01/2024 at 11:39 PM Performed by: Alm VEAR Cave Consent: The procedure was performed in an emergent situation. Imaging studies: imaging studies available Required items: required blood products, implants, devices, and special equipment available Patient identity confirmed: arm band and available demographic data Time out: Immediately prior to procedure a time out was called to verify the correct  patient, procedure, equipment, support staff and site/side marked as required.  Indications: pneumothorax   Patient sedated: no Anesthesia: yes Preparation: skin prepped with Betadine  Placement location: L lateral chest   Scalpel size: 10 Tube size: 14 French Dissection instrument: finger and Kelly clamp  Tension pneumothorax heard: no Tube connected to: water seal   Suture material: -0 silk Dressing: central line   Post-insertion x-ray findings: pneumothorax improved   Patient tolerance: Patient tolerated the procedure well with no immediate complications     Medications Ordered in the ED  lidocaine (XYLOCAINE) 2 % (with pres) injection 200 mg (has no  administration in time range)  albuterol (PROVENTIL) (2.5 MG/3ML) 0.083% nebulizer solution 5 mg (5 mg Nebulization Given 01/01/24 2139)  ipratropium (ATROVENT) nebulizer solution 0.5 mg (0.5 mg Nebulization Given 01/01/24 2139)  methylPREDNISolone sodium succinate (SOLU-MEDROL) 125 mg/2 mL injection 125 mg (125 mg Intravenous Given 01/01/24 2134)  LORazepam (ATIVAN) injection 1 mg (1 mg Intravenous Given 01/01/24 2133)  iohexol (OMNIPAQUE) 350 MG/ML injection 75 mL (75 mLs Intravenous Contrast Given 01/01/24 2228)  LORazepam (ATIVAN) injection 1 mg (1 mg Intravenous Given 01/01/24 2310)  ketamine (KETALAR) injection 15 mg (15 mg Intravenous Given 01/01/24 2312)                                    Medical Decision Making Morgyn Marut is a 48 y.o. female here presenting with shortness of breath.  Patient has history of COPD so consider pulm lab from COPD versus spontaneous pneumothorax from COPD.  Plan to get CT PE to look for bleb versus pneumothorax.  11:38 PM CT showed 40% pneumothorax.  Patient has no PE.  I was able to place a chest tube and it was confirmed by chest x-ray.  Discussed with Dr. Franky from hospitalist who will admit.  I also discussed with Dr. Kerrin from CT surgery, who request that hospitalist call him in the morning.  Repeat chest x-ray showed that the chest tube is in place and the lung has reinflated    Problems Addressed: COPD exacerbation (HCC): acute illness or injury Pneumothorax, unspecified type: acute illness or injury  Amount and/or Complexity of Data Reviewed Labs: ordered. Decision-making details documented in ED Course. Radiology: ordered and independent interpretation performed. Decision-making details documented in ED Course. ECG/medicine tests: ordered and independent interpretation performed. Decision-making details documented in ED Course.  Risk Prescription drug management. Decision regarding hospitalization.     Final diagnoses:   None    ED Discharge Orders     None          Patt Alm Macho, MD 01/01/24 2350

## 2024-01-01 NOTE — ED Triage Notes (Signed)
 Pt with COPD exacerbation for the past 2 weeks. Has been seen twice at an urgent care and given meds and then had an x-ray which they called her about today telling her to present  to the ED for eval of collapsed lung and need for chest CT.  Does wear O2 at home prn

## 2024-01-01 NOTE — ED Notes (Signed)
 Unsuccessful lab draw x 2 in triage

## 2024-01-02 ENCOUNTER — Encounter (HOSPITAL_COMMUNITY): Payer: Self-pay | Admitting: Internal Medicine

## 2024-01-02 ENCOUNTER — Emergency Department (HOSPITAL_BASED_OUTPATIENT_CLINIC_OR_DEPARTMENT_OTHER): Payer: PRIVATE HEALTH INSURANCE

## 2024-01-02 DIAGNOSIS — J9311 Primary spontaneous pneumothorax: Secondary | ICD-10-CM

## 2024-01-02 DIAGNOSIS — J441 Chronic obstructive pulmonary disease with (acute) exacerbation: Secondary | ICD-10-CM | POA: Diagnosis present

## 2024-01-02 DIAGNOSIS — J9383 Other pneumothorax: Secondary | ICD-10-CM | POA: Diagnosis not present

## 2024-01-02 DIAGNOSIS — Z1152 Encounter for screening for COVID-19: Secondary | ICD-10-CM | POA: Diagnosis not present

## 2024-01-02 DIAGNOSIS — Z885 Allergy status to narcotic agent status: Secondary | ICD-10-CM | POA: Diagnosis not present

## 2024-01-02 DIAGNOSIS — Z880 Allergy status to penicillin: Secondary | ICD-10-CM | POA: Diagnosis not present

## 2024-01-02 DIAGNOSIS — J439 Emphysema, unspecified: Secondary | ICD-10-CM | POA: Diagnosis not present

## 2024-01-02 DIAGNOSIS — J9811 Atelectasis: Secondary | ICD-10-CM | POA: Diagnosis present

## 2024-01-02 DIAGNOSIS — J939 Pneumothorax, unspecified: Secondary | ICD-10-CM | POA: Diagnosis not present

## 2024-01-02 DIAGNOSIS — K219 Gastro-esophageal reflux disease without esophagitis: Secondary | ICD-10-CM | POA: Diagnosis present

## 2024-01-02 DIAGNOSIS — Z79891 Long term (current) use of opiate analgesic: Secondary | ICD-10-CM | POA: Diagnosis not present

## 2024-01-02 DIAGNOSIS — F17210 Nicotine dependence, cigarettes, uncomplicated: Secondary | ICD-10-CM | POA: Diagnosis present

## 2024-01-02 DIAGNOSIS — J449 Chronic obstructive pulmonary disease, unspecified: Secondary | ICD-10-CM | POA: Diagnosis not present

## 2024-01-02 DIAGNOSIS — Z79899 Other long term (current) drug therapy: Secondary | ICD-10-CM | POA: Diagnosis not present

## 2024-01-02 LAB — CBC
HCT: 39.2 % (ref 36.0–46.0)
Hemoglobin: 12.7 g/dL (ref 12.0–15.0)
MCH: 29.3 pg (ref 26.0–34.0)
MCHC: 32.4 g/dL (ref 30.0–36.0)
MCV: 90.3 fL (ref 80.0–100.0)
Platelets: 451 K/uL — ABNORMAL HIGH (ref 150–400)
RBC: 4.34 MIL/uL (ref 3.87–5.11)
RDW: 14.4 % (ref 11.5–15.5)
WBC: 11.4 K/uL — ABNORMAL HIGH (ref 4.0–10.5)
nRBC: 0 % (ref 0.0–0.2)

## 2024-01-02 LAB — COMPREHENSIVE METABOLIC PANEL WITH GFR
ALT: 14 U/L (ref 0–44)
AST: 17 U/L (ref 15–41)
Albumin: 3.3 g/dL — ABNORMAL LOW (ref 3.5–5.0)
Alkaline Phosphatase: 57 U/L (ref 38–126)
Anion gap: 11 (ref 5–15)
BUN: 16 mg/dL (ref 6–20)
CO2: 30 mmol/L (ref 22–32)
Calcium: 9.1 mg/dL (ref 8.9–10.3)
Chloride: 96 mmol/L — ABNORMAL LOW (ref 98–111)
Creatinine, Ser: 0.85 mg/dL (ref 0.44–1.00)
GFR, Estimated: >60 mL/min (ref >60)
Glucose, Bld: 86 mg/dL (ref 70–99)
Potassium: 4.6 mmol/L (ref 3.5–5.1)
Sodium: 137 mmol/L (ref 135–145)
Total Bilirubin: 0.4 mg/dL (ref 0.0–1.2)
Total Protein: 6.6 g/dL (ref 6.5–8.1)

## 2024-01-02 LAB — HIV ANTIBODY (ROUTINE TESTING W REFLEX): HIV Screen 4th Generation wRfx: NONREACTIVE

## 2024-01-02 LAB — TROPONIN T, HIGH SENSITIVITY: Troponin T High Sensitivity: <15 ng/L (ref 0–19)

## 2024-01-02 MED ORDER — KETOROLAC TROMETHAMINE 15 MG/ML IJ SOLN
15.0000 mg | Freq: Three times a day (TID) | INTRAMUSCULAR | Status: DC | PRN
  Administered 2024-01-02 – 2024-01-03 (×2): 15 mg via INTRAVENOUS
  Filled 2024-01-02 (×2): qty 1

## 2024-01-02 MED ORDER — POLYETHYLENE GLYCOL 3350 17 G PO PACK: 17.0000 g | PACK | Freq: Every day | ORAL | Status: DC | PRN

## 2024-01-02 MED ORDER — HYDROMORPHONE HCL 1 MG/ML IJ SOLN
1.0000 mg | INTRAMUSCULAR | Status: DC | PRN
  Administered 2024-01-02 – 2024-01-03 (×4): 1 mg via INTRAVENOUS
  Filled 2024-01-02 (×4): qty 1

## 2024-01-02 MED ORDER — ONDANSETRON HCL 4 MG/2ML IJ SOLN
4.0000 mg | Freq: Once | INTRAMUSCULAR | Status: AC
  Administered 2024-01-02: 4 mg via INTRAVENOUS
  Filled 2024-01-02: qty 2

## 2024-01-02 MED ORDER — ENOXAPARIN SODIUM 40 MG/0.4ML IJ SOSY
40.0000 mg | PREFILLED_SYRINGE | INTRAMUSCULAR | Status: DC
  Administered 2024-01-02 – 2024-01-04 (×3): 40 mg via SUBCUTANEOUS
  Filled 2024-01-02 (×3): qty 0.4

## 2024-01-02 MED ORDER — ACETAMINOPHEN 325 MG PO TABS
650.0000 mg | ORAL_TABLET | Freq: Four times a day (QID) | ORAL | Status: DC | PRN
  Administered 2024-01-04: 650 mg via ORAL
  Filled 2024-01-02: qty 2

## 2024-01-02 MED ORDER — FENTANYL CITRATE (PF) 50 MCG/ML IJ SOSY
50.0000 ug | PREFILLED_SYRINGE | Freq: Once | INTRAMUSCULAR | Status: AC
  Administered 2024-01-02: 50 ug via INTRAVENOUS
  Filled 2024-01-02: qty 1

## 2024-01-02 MED ORDER — SODIUM CHLORIDE 0.9% FLUSH
3.0000 mL | Freq: Two times a day (BID) | INTRAVENOUS | Status: DC
  Administered 2024-01-02 – 2024-01-05 (×6): 3 mL via INTRAVENOUS

## 2024-01-02 MED ORDER — HYDROMORPHONE HCL 1 MG/ML IJ SOLN
1.0000 mg | Freq: Once | INTRAMUSCULAR | Status: AC
  Administered 2024-01-02: 1 mg via INTRAVENOUS
  Filled 2024-01-02: qty 1

## 2024-01-02 MED ORDER — FLUTICASONE FUROATE-VILANTEROL 200-25 MCG/ACT IN AEPB
1.0000 | INHALATION_SPRAY | Freq: Every day | RESPIRATORY_TRACT | Status: DC
  Administered 2024-01-03 – 2024-01-05 (×3): 1 via RESPIRATORY_TRACT
  Filled 2024-01-02: qty 28

## 2024-01-02 MED ORDER — KETOROLAC TROMETHAMINE 15 MG/ML IJ SOLN
15.0000 mg | Freq: Once | INTRAMUSCULAR | Status: AC
  Administered 2024-01-02: 15 mg via INTRAVENOUS
  Filled 2024-01-02: qty 1

## 2024-01-02 MED ORDER — SODIUM CHLORIDE 0.9% FLUSH
10.0000 mL | Freq: Three times a day (TID) | INTRAVENOUS | Status: DC
  Administered 2024-01-02 – 2024-01-05 (×10): 10 mL via INTRAPLEURAL
  Filled 2024-01-02: qty 10

## 2024-01-02 MED ORDER — ALBUTEROL SULFATE (2.5 MG/3ML) 0.083% IN NEBU: 3.0000 mL | INHALATION_SOLUTION | RESPIRATORY_TRACT | Status: DC | PRN

## 2024-01-02 MED ORDER — ACETAMINOPHEN 650 MG RE SUPP: 650.0000 mg | Freq: Four times a day (QID) | RECTAL | Status: DC | PRN

## 2024-01-02 NOTE — ED Notes (Signed)
 Per MD pt was taken off NRB mask and placed on 2 lpm [place] after chest tube placement.

## 2024-01-02 NOTE — ED Notes (Signed)
 Called to room pt woke up in 10 out of 10 pain and also needed to use the bathroom.  Pt was placed on the bedpan and assisted with same.  Pt was also more aware of the chest tube.  She was medicated for her anxiety and pain at this time.  Will continue to monitor.

## 2024-01-02 NOTE — Progress Notes (Addendum)
 [facility]  EXPEDITER PROGRESS NOTE  Patient Name: Jessica Wolf  DOB:[birth year] Date of Admission: 01/01/2024  Date of Assessment:01/02/24   -------------------------------------------------------------------------------------------------------------------   Brief clinical summary: Pt admitted for Pneumothorax and LEFT Chest tube placed in ER.  Labs, test, and orders reviewed: Yes  30-day Readmission: No  Discharge order: No  Current discharge plan: Pt awaiting admission to [facility].  If anticipated for next day discharge, barriers to discharge before 11am identified: N/A   Intervention provided by [facility] team:  Chart review for level loading and admission.   Barrier resolved: not applicable  ADDENDUM: Updated Aleck RN that at this time pt will admit to 4E27 once bed is cleaned.    -------------------------------------------------------------------------------------------------------------------  [facility] RN Expediter, Taran Haynesworth Please contact us  directly via secure chat (search for [facility]) or by calling us  at [phone] [facility]).

## 2024-01-02 NOTE — ED Notes (Signed)
 Assisted in moving pt into new ED room due to staffing.  Chest tube was confirmed in good working condition.  RN assuming care at this time was given report of same.

## 2024-01-02 NOTE — H&P (Addendum)
 History and Physical   Emmajean Ratledge FMW:[id number] DOB: 10-17-1975 DOA: 01/01/2024  PCP: Bobbette Coye LABOR, MD   Patient coming from: Home  Chief Complaint: Shortness of breath  HPI: Jessica Wolf is a 48 y.o. female with medical history significant of COPD presenting with 5 days of shortness of breath.  Patient evaluated at urgent care 2 days prior and initial read of x-ray was normal and patient was prescribed doxycycline .  X-ray was over read today with findings of pneumothorax and patient was sent to the ED.  As above, patient has history of COPD on intermittent oxygen as needed.  Denies any trauma.  Left sided chest pain along the dermatome at the rib level of chest tube insertion.   Further denies abdominal pain, constipation, diarrhea, nausea, vomiting.  ED Course: Vital signs in the ED notable for blood pressure 90s-140 systolic, placed on 2 L of supplemental oxygen.  Lab workup included BMP with BUN 25.  CBC with platelets 432.  PT and INR normal.  Troponin negative x 2.  Respiratory panel for flu COVID and RSV negative.  Chest x-ray with moderate left pneumothorax.  Repeat chest x-ray showed pigtail catheter in place with trace residual pneumothorax.  X-ray this morning showed stable small apical pneumothorax with some subcutaneous emphysema.  CTA PE study yesterday confirmed moderate left pneumothorax with atelectasis and emphysema.  Patient received fentanyl , Dilaudid , Toradol , ketamine , Ativan , Valium , Solu-Medrol , Zofran , Atrovent , albuterol .  Discussed with CT surgery overnight by EDP who recommended to notify them upon patient arrival.  Review of Systems: As per HPI otherwise all other systems reviewed and are negative.  Past Medical History:  Diagnosis Date   CAP (community acquired pneumonia) 06/06/2020   COPD exacerbation (HCC) 06/06/2020   Drug abuse (HCC)    GERD (gastroesophageal reflux disease)     Past Surgical History:  Procedure Laterality Date    APPENDECTOMY     CHOLECYSTECTOMY     HAND SURGERY Right     Social History  reports that she has been smoking cigarettes. She has never used smokeless tobacco. She reports that she does not drink alcohol and does not use drugs.  Allergies  Allergen Reactions   Penicillins Shortness Of Breath   Morphine And Codeine Hives   History reviewed. No pertinent family history.   Prior to Admission medications   Medication Sig Start Date End Date Taking? Authorizing Provider  acetaminophen  (TYLENOL ) 500 MG tablet Take 2 tablets (1,000 mg total) by mouth every 6 (six) hours as needed. 01/06/17   Armenta Canning, MD  buprenorphine-naloxone (SUBOXONE) 2-0.5 mg SUBL SL tablet Place 1 tablet under the tongue daily.    [provider]  doxycycline  (VIBRAMYCIN ) 100 MG capsule Take 1 capsule (100 mg total) by mouth 2 (two) times daily. One po bid x 7 days 01/06/17   Armenta Canning, MD  pantoprazole  (PROTONIX ) 20 MG tablet Take 1 tablet (20 mg total) by mouth daily. 01/06/17   Armenta Canning, MD  polyethylene glycol (MIRALAX  / GLYCOLAX ) packet Take 17 g by mouth daily. 01/06/17   Armenta Canning, MD  traMADol  (ULTRAM ) 50 MG tablet Take 2 tablets (100 mg total) by mouth every 6 (six) hours as needed. 01/06/17   Armenta Canning, MD    Physical Exam: Vitals:   01/02/24 1415 01/02/24 1530 01/02/24 1533 01/02/24 1652  BP: 110/75 121/76  114/75  Pulse: 76 87  75  Resp: 20 17  15   Temp:   98.3 F (36.8 C) 98.1 F (36.7 C)  TempSrc:   Oral Oral  SpO2: 96% 96%  96%  Weight:        Physical Exam Constitutional:      General: She is not in acute distress.    Appearance: Normal appearance.  HENT:     Head: Normocephalic and atraumatic.     Mouth/Throat:     Mouth: Mucous membranes are moist.     Pharynx: Oropharynx is clear.  Eyes:     Extraocular Movements: Extraocular movements intact.     Pupils: Pupils are equal, round, and reactive to light.  Cardiovascular:     Rate and Rhythm:  Normal rate and regular rhythm.     Pulses: Normal pulses.     Heart sounds: Normal heart sounds.  Pulmonary:     Effort: Pulmonary effort is normal. No respiratory distress.     Breath sounds: Normal breath sounds.     Comments: Chest tube in place on right to suction Abdominal:     General: Bowel sounds are normal. There is no distension.     Palpations: Abdomen is soft.     Tenderness: There is no abdominal tenderness.  Musculoskeletal:        General: No swelling or deformity.  Skin:    General: Skin is warm and dry.  Neurological:     General: No focal deficit present.     Mental Status: Mental status is at baseline.     Labs on Admission: I have personally reviewed following labs and imaging studies  CBC: Recent Labs  Lab 01/01/24 2126  WBC 9.9  HGB 12.8  HCT 37.7  MCV 88.9  PLT 432*    Basic Metabolic Panel: Recent Labs  Lab 01/01/24 2126  NA 137  K 3.9  CL 99  CO2 27  GLUCOSE 98  BUN 25*  CREATININE 0.75  CALCIUM 9.4    GFR: CrCl cannot be calculated (Unknown ideal weight.).  Liver Function Tests: No results for input(s): AST, ALT, ALKPHOS, BILITOT, PROT, ALBUMIN in the last 168 hours.  Urine analysis:    Component Value Date/Time   COLORURINE YELLOW 01/06/2017 1958   APPEARANCEUR CLOUDY (A) 01/06/2017 1958   LABSPEC 1.025 01/06/2017 1958   PHURINE 6.0 01/06/2017 1958   GLUCOSEU NEGATIVE 01/06/2017 1958   HGBUR NEGATIVE 01/06/2017 1958   BILIRUBINUR NEGATIVE 01/06/2017 1958   KETONESUR NEGATIVE 01/06/2017 1958   PROTEINUR NEGATIVE 01/06/2017 1958   NITRITE NEGATIVE 01/06/2017 1958   LEUKOCYTESUR NEGATIVE 01/06/2017 1958    Radiological Exams on Admission: DG Chest Portable 1 View Result Date: 01/02/2024 EXAM: 1 VIEW(S) XRAY OF THE CHEST 01/02/2024 07:59:00 AM COMPARISON: 01/01/2024 CLINICAL HISTORY: ptx recheck FINDINGS: LINES, TUBES AND DEVICES: Left chest tube stable in position. LUNGS AND PLEURA: Small left apical  pneumothorax, stable. Subsegmental atelectasis at left lung base. No pleural effusion. HEART AND MEDIASTINUM: No acute abnormality of the cardiac and mediastinal silhouettes. BONES AND SOFT TISSUES: Trace left chest wall subcutaneous emphysema, stable. No acute osseous abnormality. IMPRESSION: 1. Stable small left apical pneumothorax with left chest tube in place. 2. Subsegmental atelectasis at the left lung base. 3. Trace left chest wall subcutaneous emphysema. Electronically signed by: Waddell Calk MD 01/02/2024 08:15 AM EST RP Workstation: [id number]   DG Chest Port 1 View Result Date: 01/01/2024 EXAM: 1 VIEW(S) XRAY OF THE CHEST 01/01/2024 11:48:00 PM COMPARISON: 01/01/2024 CLINICAL HISTORY: chest tube placement FINDINGS: LINES, TUBES AND DEVICES: Left chest pigtail catheter placement. LUNGS AND PLEURA: No focal pulmonary opacity. No pleural effusion. Trace  residual pneumothorax within the costophrenic sulcus. HEART AND MEDIASTINUM: No acute abnormality of the cardiac and mediastinal silhouettes. BONES AND SOFT TISSUES: No acute osseous abnormality. Trace left chest wall subcutaneous emphysema. IMPRESSION: 1. Left chest pigtail catheter placement. 2. Trace residual pneumothorax within the costophrenic sulcus. 3. Trace left chest wall subcutaneous emphysema. Electronically signed by: Dorethia Molt MD 01/01/2024 11:52 PM EST RP Workstation: [id number]   CT Angio Chest PE W and/or Wo Contrast Result Date: 01/01/2024 CLINICAL DATA:  High probability for PE.  Pneumothorax. EXAM: CT ANGIOGRAPHY CHEST WITH CONTRAST TECHNIQUE: Multidetector CT imaging of the chest was performed using the standard protocol during bolus administration of intravenous contrast. Multiplanar CT image reconstructions and MIPs were obtained to evaluate the vascular anatomy. RADIATION DOSE REDUCTION: This exam was performed according to the departmental dose-optimization program which includes automated exposure control, adjustment of  the mA and/or kV according to patient size and/or use of iterative reconstruction technique. CONTRAST:  75mL OMNIPAQUE  IOHEXOL  350 MG/ML SOLN COMPARISON:  Chest x-ray same day.  CT chest 01/06/2017. FINDINGS: Cardiovascular: Satisfactory opacification of the pulmonary arteries to the segmental level. No evidence of pulmonary embolism. Normal heart size. No pericardial effusion. Mediastinum/Nodes: No enlarged mediastinal, hilar, or axillary lymph nodes. Thyroid gland, trachea, and esophagus demonstrate no significant findings. Lungs/Pleura: Moderate-sized left pneumothorax is present (proximally 40%. There is atelectasis of the left lower lobe and lingula. Mild emphysema present throughout both lungs. No other focal lung infiltrate or pleural effusion. Upper Abdomen: No acute abnormality.  Gallbladder surgically absent. Musculoskeletal: No chest wall abnormality. No acute or significant osseous findings. Review of the MIP images confirms the above findings. IMPRESSION: 1. No evidence for pulmonary embolism. 2. Moderate-sized left pneumothorax (proximally 40%). 3. Atelectasis of the left lower lobe and lingula. 4. Emphysema. Emphysema (ICD10-J43.9). Electronically Signed   By: Greig Pique M.D.   On: 01/01/2024 22:56   DG Chest 2 View Result Date: 01/01/2024 CLINICAL DATA:  Shortness of breath. EXAM: DG CHEST 2V COMPARISON:  Chest radiograph dated 03/31/2021. FINDINGS: Background of emphysema. There is a moderate size left pneumothorax, proximally 20%. No pleural effusion. The cardiac silhouette is within normal limits. No acute osseous pathology. IMPRESSION: Moderate size left pneumothorax. These results were called by telephone at the time of interpretation on 01/01/2024 at 9:05 pm to provider DAVID YAO , who verbally acknowledged these results. Electronically Signed   By: Vanetta Chou M.D.   On: 01/01/2024 21:16   EKG: Ordered but not performed/not available for review  Assessment/Plan Principal  Problem:   Pneumothorax   Spontaneous pneumothorax > 5 days shortness of breath.  Noted to have pneumothorax on x-ray.  Pigtail catheter placed with improvement on repeat chest x-ray.  Stable chest x-ray this morning as well. > No trauma.  Believed to be spontaneous pneumothorax.  In the setting of COPD. > Cardiothoracic surgery consulted overnight by EDP recommended reconsult after patient  arrival to [facility]. - Monitoring on telemetry with continuous pulse ox - Oxygen therapy at 2 L - Attempting to notify CT Surgery of patient arrival. - Continue pigtail catheter to suction for now - Supportive care  COPD - Replace home Advair with Breo - PRN albuterol   DVT prophylaxis: Lovenox  Code Status:   Full Family Communication:  None on admission  Disposition Plan:   Patient is from:  Home  Anticipated DC to:  Home  Anticipated DC date:  2 days  Anticipated DC barriers: None  Consults called:  Cardiothoracic surgery (Dr. Kerrin is  on treatment team, reaching out to on call provider to notify their team of patient arrival arrival)  Admission status:  Inpatient, telemetry  Severity of Illness: The appropriate patient status for this patient is INPATIENT. Inpatient status is judged to be reasonable and necessary in order to provide the required intensity of service to ensure the patient's safety. The patient's presenting symptoms, physical exam findings, and initial radiographic and laboratory data in the context of their chronic comorbidities is felt to place them at high risk for further clinical deterioration. Furthermore, it is not anticipated that the patient will be medically stable for discharge from the hospital within 2 midnights of admission.   * I certify that at the point of admission it is my clinical judgment that the patient will require inpatient hospital care spanning beyond 2 midnights from the point of admission due to high intensity of service, high risk for further  deterioration and high frequency of surveillance required.DEWAINE Marsa KATHEE Seena MD [facility]  How to contact the [facility] Attending or Consulting provider 7A - 7P or covering provider during after hours 7P -7A, for this patient?   Check the care team in [facility] and look for a) attending/consulting [facility] provider listed and b) the [facility] team listed Log into [email or web address] and use [facility] universal password to access. If you do not have the password, please contact the hospital operator. Locate the [facility] provider you are looking for under [facility] and page to a number that you can be directly reached. If you still have difficulty reaching the provider, please page the [facility] (Director on Call) for the Hospitalists listed on amion for assistance.  01/02/2024, 5:23 PM

## 2024-01-03 DIAGNOSIS — J939 Pneumothorax, unspecified: Secondary | ICD-10-CM

## 2024-01-03 DIAGNOSIS — J9383 Other pneumothorax: Secondary | ICD-10-CM

## 2024-01-03 DIAGNOSIS — J439 Emphysema, unspecified: Secondary | ICD-10-CM

## 2024-01-03 DIAGNOSIS — J449 Chronic obstructive pulmonary disease, unspecified: Secondary | ICD-10-CM

## 2024-01-03 LAB — CBC
HCT: 39 % (ref 36.0–46.0)
Hemoglobin: 12.6 g/dL (ref 12.0–15.0)
MCH: 29 pg (ref 26.0–34.0)
MCHC: 32.3 g/dL (ref 30.0–36.0)
MCV: 89.7 fL (ref 80.0–100.0)
Platelets: 428 K/uL — ABNORMAL HIGH (ref 150–400)
RBC: 4.35 MIL/uL (ref 3.87–5.11)
RDW: 14.4 % (ref 11.5–15.5)
WBC: 9.2 K/uL (ref 4.0–10.5)
nRBC: 0 % (ref 0.0–0.2)

## 2024-01-03 LAB — COMPREHENSIVE METABOLIC PANEL WITH GFR
ALT: 13 U/L (ref 0–44)
AST: 14 U/L — ABNORMAL LOW (ref 15–41)
Albumin: 2.9 g/dL — ABNORMAL LOW (ref 3.5–5.0)
Alkaline Phosphatase: 53 U/L (ref 38–126)
Anion gap: 6 (ref 5–15)
BUN: 18 mg/dL (ref 6–20)
CO2: 32 mmol/L (ref 22–32)
Calcium: 8.7 mg/dL — ABNORMAL LOW (ref 8.9–10.3)
Chloride: 99 mmol/L (ref 98–111)
Creatinine, Ser: 1.04 mg/dL — ABNORMAL HIGH (ref 0.44–1.00)
GFR, Estimated: >60 mL/min (ref >60)
Glucose, Bld: 91 mg/dL (ref 70–99)
Potassium: 4 mmol/L (ref 3.5–5.1)
Sodium: 137 mmol/L (ref 135–145)
Total Bilirubin: 0.5 mg/dL (ref 0.0–1.2)
Total Protein: 5.8 g/dL — ABNORMAL LOW (ref 6.5–8.1)

## 2024-01-03 MED ORDER — DIAZEPAM 5 MG PO TABS
5.0000 mg | ORAL_TABLET | Freq: Three times a day (TID) | ORAL | Status: DC | PRN
  Administered 2024-01-04 – 2024-01-05 (×3): 5 mg via ORAL
  Filled 2024-01-03 (×3): qty 1

## 2024-01-03 MED ORDER — POLYETHYLENE GLYCOL 3350 17 G PO PACK
17.0000 g | PACK | Freq: Every day | ORAL | Status: DC
  Administered 2024-01-03 – 2024-01-05 (×3): 17 g via ORAL
  Filled 2024-01-03 (×3): qty 1

## 2024-01-03 MED ORDER — PANTOPRAZOLE SODIUM 20 MG PO TBEC
20.0000 mg | DELAYED_RELEASE_TABLET | Freq: Every day | ORAL | Status: DC
  Administered 2024-01-03 – 2024-01-05 (×3): 20 mg via ORAL
  Filled 2024-01-03 (×3): qty 1

## 2024-01-03 MED ORDER — KETOROLAC TROMETHAMINE 15 MG/ML IJ SOLN
15.0000 mg | Freq: Three times a day (TID) | INTRAMUSCULAR | Status: DC
Start: 2024-01-03 — End: 2024-01-06
  Administered 2024-01-03 – 2024-01-05 (×7): 15 mg via INTRAVENOUS
  Filled 2024-01-03 (×7): qty 1

## 2024-01-03 MED ORDER — ONDANSETRON HCL 4 MG/2ML IJ SOLN: 4.0000 mg | Freq: Once | INTRAMUSCULAR | Status: DC

## 2024-01-03 MED ORDER — ONDANSETRON HCL 4 MG/2ML IJ SOLN
4.0000 mg | Freq: Four times a day (QID) | INTRAMUSCULAR | Status: DC | PRN
  Administered 2024-01-03: 4 mg via INTRAVENOUS
  Filled 2024-01-03: qty 2

## 2024-01-03 MED ORDER — OXYCODONE HCL 5 MG PO TABS
5.0000 mg | ORAL_TABLET | ORAL | Status: DC | PRN
  Administered 2024-01-03: 5 mg via ORAL
  Administered 2024-01-03: 10 mg via ORAL
  Administered 2024-01-03: 5 mg via ORAL
  Administered 2024-01-04 – 2024-01-05 (×5): 10 mg via ORAL
  Filled 2024-01-03: qty 2
  Filled 2024-01-03: qty 1
  Filled 2024-01-03 (×6): qty 2

## 2024-01-03 MED ORDER — HYDROMORPHONE HCL 1 MG/ML IJ SOLN
1.0000 mg | INTRAMUSCULAR | Status: DC | PRN
  Administered 2024-01-03 (×2): 2 mg via INTRAVENOUS
  Administered 2024-01-03: 1 mg via INTRAVENOUS
  Administered 2024-01-03 – 2024-01-04 (×2): 2 mg via INTRAVENOUS
  Filled 2024-01-03 (×3): qty 2
  Filled 2024-01-03: qty 1
  Filled 2024-01-03: qty 2

## 2024-01-03 NOTE — Progress Notes (Signed)
 Jessica Wolf  FMW:[id number] DOB: 16-Sep-1975 DOA: 01/01/2024 PCP: Bobbette Coye LABOR, MD    Brief Narrative:  48 year old with a history of COPD on as needed home O2 who presented to the [facility] ER 11/20 with a 5-day history of worsening shortness of breath.  She was initially seen at a local urgent care center 2 days prior at which time the initial read of her chest x-ray was unremarkable.  Her x-ray was over read on the date of her presentation at which time mention was made of a pneumothorax and as a result the patient was contacted and directed to the ED.  In the ER CTa of the chest was obtained which ruled out pulmonary embolism but did note a 40% left pneumothorax with atelectasis of the left lower lobe and lingula.  A pigtail chest tube was inserted by the EDP 11/20.  The patient was subsequently transferred to [facility] to be followed by the [facility].  Goals of Care:   Code Status: Full Code   DVT prophylaxis: enoxaparin  (LOVENOX ) injection 40 mg Start: 01/02/24 2000   Interim Hx: Afebrile since admission.  Vital signs stable.  Oxygen saturation 99% on 1 L nasal cannula.  Denies significant shortness of breath but does report ongoing severe chest wall pain associated with her chest tube.  Denies nausea or vomiting.  Reports good intake.  Assessment & Plan:  Spontaneous left pneumothorax In setting of underlying COPD - pigtail chest tube placed in ED 11/20 - chest tube management per [facility]CTS with patient progressing well  COPD Continue usual inhaled medications - no acute exacerbation at present   Family Communication: No family present at time of exam Disposition: Anticipate discharge home when chest tube able to be removed   Objective: Blood pressure 92/72, pulse 74, temperature 97.8 F (36.6 C), temperature source Oral, resp. rate 15, weight 51.3 kg, SpO2 97%.  Intake/Output Summary (Last 24 hours) at 01/03/2024 0929 Last data filed at 01/03/2024 0755 Gross  per 24 hour  Intake 410 ml  Output 300 ml  Net 110 ml   Filed Weights   01/01/24 2253  Weight: 51.3 kg    Examination: General: No acute respiratory distress Lungs: Clear to auscultation bilaterally without wheezes or crackles Cardiovascular: Regular rate and rhythm without murmur gallop or rub normal S1 and S2 Abdomen: Nontender, nondistended, soft, bowel sounds positive, no rebound, no ascites, no appreciable mass Extremities: No significant cyanosis, clubbing, or edema bilateral lower extremities  CBC: Recent Labs  Lab 01/01/24 2126 01/02/24 1755 01/03/24 0330  WBC 9.9 11.4* 9.2  HGB 12.8 12.7 12.6  HCT 37.7 39.2 39.0  MCV 88.9 90.3 89.7  PLT 432* 451* 428*   Basic Metabolic Panel: Recent Labs  Lab 01/01/24 2126 01/02/24 1755 01/03/24 0330  NA 137 137 137  K 3.9 4.6 4.0  CL 99 96* 99  CO2 27 30 32  GLUCOSE 98 86 91  BUN 25* 16 18  CREATININE 0.75 0.85 1.04*  CALCIUM 9.4 9.1 8.7*   GFR: CrCl cannot be calculated (Unknown ideal weight.).   Scheduled Meds:  enoxaparin  (LOVENOX ) injection  40 mg Subcutaneous Q24H   fluticasone  furoate-vilanterol  1 puff Inhalation Daily   sodium chloride  flush  10 mL Intrapleural Q8H   sodium chloride  flush  3 mL Intravenous Q12H     LOS: 1 day   Reyes IVAR Moores, MD [facility] Office  [phone] Pager - Text Page per Amion  If 7PM-7AM, please contact night-coverage  per Amion 01/03/2024, 9:29 AM

## 2024-01-03 NOTE — Consult Note (Addendum)
 [address] [place] CH[place] [id number]             [phone]    Reason for Consult:pneumothorax Referring Physician: ER  YEP:Jfazm Boulos is an 48 y.o. female with a past medical history significant for COPD who presented to the [facility] with complaints of worsening shortness of breath over the previous 5 days.  She presented to urgent care 2 days prior to that and was treated for bronchitis with doxycycline .  An x-ray was done at that time but it was further reviewed and findings showed a pneumothorax and the patient was then told to present to the emergency department.  She is on as needed oxygen at home.  She denies any trauma.  A chest tube was placed that she was transferred to [facility] for further evaluation and treatment including cardiothoracic surgical consultation.  A chest CTA did show findings of a moderate size left pneumothorax with atelectasis and emphysema.  She is a current smoker.  She was admitted to the medical service.  Chest tube is on suction.  Past Medical History:  Diagnosis Date   CAP (community acquired pneumonia) 06/06/2020   COPD exacerbation (HCC) 06/06/2020   Drug abuse (HCC)    GERD (gastroesophageal reflux disease)     Past Surgical History:  Procedure Laterality Date   APPENDECTOMY     CHOLECYSTECTOMY     HAND SURGERY Right    Patient Active Problem List   Diagnosis Date Noted   Pneumothorax 01/01/2024   Centrilobular emphysema (HCC) 12/01/2020   Stage 3 severe COPD by GOLD classification (HCC) 12/01/2020    History reviewed. No pertinent family history.  Social History:  reports that she has been smoking cigarettes. She has never used smokeless tobacco. She reports that she does not drink alcohol and does not use drugs.  Allergies:  Allergies  Allergen Reactions   Penicillins Shortness Of Breath   Morphine And Codeine Hives    Medications:  Current Outpatient Medications  Medication Instructions    acetaminophen  (TYLENOL ) 1,000 mg, Oral, Every 6 hours PRN   albuterol  (VENTOLIN  HFA) 108 (90 Base) MCG/ACT inhaler 2 puffs, Inhalation, Every 4 hours PRN   buprenorphine-naloxone (SUBOXONE) 2-0.5 mg SUBL SL tablet 1 tablet, Sublingual, Daily   doxycycline  (VIBRAMYCIN ) 100 mg, Oral, 2 times daily, One po bid x 7 days   fluticasone -salmeterol (ADVAIR) 250-50 MCG/ACT AEPB 1 puff, Inhalation, 2 times daily   pantoprazole  (PROTONIX ) 20 mg, Oral, Daily   polyethylene glycol (MIRALAX  / GLYCOLAX ) 17 g, Oral, Daily   traMADol  (ULTRAM ) 100 mg, Oral, Every 6 hours PRN     Results for orders placed or performed during the hospital encounter of 01/01/24 (from the past 48 hours)  Resp panel by RT-PCR (RSV, Flu A&B, Covid) Anterior Nasal Swab     Status: None   Collection Time: 01/01/24  8:38 PM   Specimen: Anterior Nasal Swab  Result Value Ref Range   SARS Coronavirus 2 by RT PCR NEGATIVE NEGATIVE    Comment: (NOTE) SARS-CoV-2 target nucleic acids are NOT DETECTED.  The SARS-CoV-2 RNA is generally detectable in upper respiratory specimens during the acute phase of infection. The lowest concentration of SARS-CoV-2 viral copies this assay can detect is 138 copies/mL. A negative result does not preclude SARS-Cov-2 infection and should not be used as the sole basis for treatment or other patient management decisions. A negative result may occur with  improper  specimen collection/handling, submission of specimen other than nasopharyngeal swab, presence of viral mutation(s) within the areas targeted by this assay, and inadequate number of viral copies(<138 copies/mL). A negative result must be combined with clinical observations, patient history, and epidemiological information. The expected result is Negative.  Fact Sheet for Patients:  bloggercourse.com  Fact Sheet for Healthcare Providers:  seriousbroker.it  This test is no t yet approved or  cleared by the [place]  FDA and  has been authorized for detection and/or diagnosis of SARS-CoV-2 by FDA under an Emergency Use Authorization (EUA). This EUA will remain  in effect (meaning this test can be used) for the duration of the COVID-19 declaration under Section 564(b)(1) of the Act, 21 U.S.C.section 360bbb-3(b)(1), unless the authorization is terminated  or revoked sooner.       Influenza A by PCR NEGATIVE NEGATIVE   Influenza B by PCR NEGATIVE NEGATIVE    Comment: (NOTE) The Xpert Xpress SARS-CoV-2/FLU/RSV plus assay is intended as an aid in the diagnosis of influenza from Nasopharyngeal swab specimens and should not be used as a sole basis for treatment. Nasal washings and aspirates are unacceptable for Xpert Xpress SARS-CoV-2/FLU/RSV testing.  Fact Sheet for Patients: bloggercourse.com  Fact Sheet for Healthcare Providers: seriousbroker.it  This test is not yet approved or cleared by the [place]  FDA and has been authorized for detection and/or diagnosis of SARS-CoV-2 by FDA under an Emergency Use Authorization (EUA). This EUA will remain in effect (meaning this test can be used) for the duration of the COVID-19 declaration under Section 564(b)(1) of the Act, 21 U.S.C. section 360bbb-3(b)(1), unless the authorization is terminated or revoked.     Resp Syncytial Virus by PCR NEGATIVE NEGATIVE    Comment: (NOTE) Fact Sheet for Patients: bloggercourse.com  Fact Sheet for Healthcare Providers: seriousbroker.it  This test is not yet approved or cleared by the [place]  FDA and has been authorized for detection and/or diagnosis of SARS-CoV-2 by FDA under an Emergency Use Authorization (EUA). This EUA will remain in effect (meaning this test can be used) for the duration of the COVID-19 declaration under Section 564(b)(1) of the Act, 21 U.S.C. section  360bbb-3(b)(1), unless the authorization is terminated or revoked.  Performed at [facility], [address] [place], [place] [address]   Basic metabolic panel     Status: Abnormal   Collection Time: 01/01/24  9:26 PM  Result Value Ref Range   Sodium 137 135 - 145 mmol/L   Potassium 3.9 3.5 - 5.1 mmol/L   Chloride 99 98 - 111 mmol/L   CO2 27 22 - 32 mmol/L   Glucose, Bld 98 70 - 99 mg/dL    Comment: Glucose reference range applies only to samples taken after fasting for at least 8 hours.   BUN 25 (H) 6 - 20 mg/dL   Creatinine, Ser 9.24 0.44 - 1.00 mg/dL   Calcium 9.4 8.9 - 89.6 mg/dL   GFR, Estimated >39 >39 mL/min    Comment: (NOTE) Calculated using the CKD-EPI Creatinine Equation (2021)    Anion gap 10 5 - 15    Comment: Performed at [facility], [address] [place], [place] [address]  CBC     Status: Abnormal   Collection Time: 01/01/24  9:26 PM  Result Value Ref Range   WBC 9.9 4.0 - 10.5 K/uL   RBC 4.24 3.87 - 5.11 MIL/uL   Hemoglobin 12.8 12.0 - 15.0 g/dL   HCT 62.2 63.9 -  46.0 %   MCV 88.9 80.0 - 100.0 fL   MCH 30.2 26.0 - 34.0 pg   MCHC 34.0 30.0 - 36.0 g/dL   RDW 85.6 88.4 - 84.4 %   Platelets 432 (H) 150 - 400 K/uL   nRBC 0.0 0.0 - 0.2 %    Comment: Performed at [facility], [address] [place], [place] [address]  Troponin T, High Sensitivity     Status: None   Collection Time: 01/01/24  9:26 PM  Result Value Ref Range   Troponin T High Sensitivity <15 0 - 19 ng/L    Comment: (NOTE) Biotin concentrations > 1000 ng/mL falsely decrease TnT results.  Serial cardiac troponin measurements are suggested.  Refer to the Links section for chest pain algorithms and additional  guidance. Performed at [facility], [address] [place], [place] [address]   hCG, serum, qualitative     Status: None   Collection Time: 01/01/24  9:26 PM  Result Value Ref Range   Preg, Serum NEGATIVE NEGATIVE    Comment:         THE SENSITIVITY OF THIS METHODOLOGY IS >10 mIU/mL. Performed at [facility], [address] [place], [place] [address]   Protime-INR     Status: None   Collection Time: 01/01/24  9:26 PM  Result Value Ref Range   Prothrombin Time 13.1 11.4 - 15.2 seconds   INR 0.9 0.8 - 1.2    Comment: (NOTE) INR goal varies based on device and disease states. Performed at [facility], [address] [place], [place] [address]   Troponin T, High Sensitivity     Status: None   Collection Time: 01/01/24 10:44 PM  Result Value Ref Range   Troponin T High Sensitivity <15 0 - 19 ng/L    Comment: (NOTE) Biotin concentrations > 1000 ng/mL falsely decrease TnT results.  Serial cardiac troponin measurements are suggested.  Refer to the Links section for chest pain algorithms and additional  guidance. Performed at [facility], [address] [place], [place] [address]   HIV Antibody (routine testing w rflx)     Status: None   Collection Time: 01/02/24  5:55 PM  Result Value Ref Range   HIV Screen 4th Generation wRfx Non Reactive Non Reactive    Comment: Performed at [facility], [address] [place], [place] [address]  Comprehensive metabolic panel     Status: Abnormal   Collection Time: 01/02/24  5:55 PM  Result Value Ref Range   Sodium 137 135 - 145 mmol/L   Potassium 4.6 3.5 - 5.1 mmol/L   Chloride 96 (L) 98 - 111 mmol/L   CO2 30 22 - 32 mmol/L   Glucose, Bld 86 70 - 99 mg/dL    Comment: Glucose reference range applies only to samples taken after fasting for at least 8 hours.   BUN 16 6 - 20 mg/dL   Creatinine, Ser 9.14 0.44 - 1.00 mg/dL   Calcium 9.1 8.9 - 89.6 mg/dL   Total Protein 6.6 6.5 - 8.1 g/dL   Albumin 3.3 (L) 3.5 - 5.0 g/dL   AST 17 15 - 41 U/L   ALT 14 0 - 44 U/L   Alkaline Phosphatase 57 38 - 126 U/L   Total Bilirubin 0.4 0.0 - 1.2 mg/dL   GFR, Estimated >39 >39 mL/min    Comment: (NOTE) Calculated using the CKD-EPI  Creatinine Equation (2021)    Anion gap 11 5 - 15    Comment: Performed at [facility], [address], [place], [place] [address]  CBC     Status: Abnormal   Collection Time: 01/02/24  5:55 PM  Result Value Ref Range   WBC 11.4 (H) 4.0 - 10.5 K/uL   RBC 4.34 3.87 - 5.11 MIL/uL   Hemoglobin 12.7 12.0 - 15.0 g/dL   HCT 60.7 63.9 - 53.9 %   MCV 90.3 80.0 - 100.0 fL   MCH 29.3 26.0 - 34.0 pg   MCHC 32.4 30.0 - 36.0 g/dL   RDW 85.5 88.4 - 84.4 %   Platelets 451 (H) 150 - 400 K/uL   nRBC 0.0 0.0 - 0.2 %    Comment: Performed at [facility], [address], [place], [place] [address]  Comprehensive metabolic panel     Status: Abnormal   Collection Time: 01/03/24  3:30 AM  Result Value Ref Range   Sodium 137 135 - 145 mmol/L   Potassium 4.0 3.5 - 5.1 mmol/L   Chloride 99 98 - 111 mmol/L   CO2 32 22 - 32 mmol/L   Glucose, Bld 91 70 - 99 mg/dL    Comment: Glucose reference range applies only to samples taken after fasting for at least 8 hours.   BUN 18 6 - 20 mg/dL   Creatinine, Ser 8.95 (H) 0.44 - 1.00 mg/dL   Calcium 8.7 (L) 8.9 - 10.3 mg/dL   Total Protein 5.8 (L) 6.5 - 8.1 g/dL   Albumin 2.9 (L) 3.5 - 5.0 g/dL   AST 14 (L) 15 - 41 U/L   ALT 13 0 - 44 U/L   Alkaline Phosphatase 53 38 - 126 U/L   Total Bilirubin 0.5 0.0 - 1.2 mg/dL   GFR, Estimated >39 >39 mL/min    Comment: (NOTE) Calculated using the CKD-EPI Creatinine Equation (2021)    Anion gap 6 5 - 15    Comment: Performed at [facility], [address], [place], [place] [address]  CBC     Status: Abnormal   Collection Time: 01/03/24  3:30 AM  Result Value Ref Range   WBC 9.2 4.0 - 10.5 K/uL   RBC 4.35 3.87 - 5.11 MIL/uL   Hemoglobin 12.6 12.0 - 15.0 g/dL   HCT 60.9 63.9 - 53.9 %   MCV 89.7 80.0 - 100.0 fL   MCH 29.0 26.0 - 34.0 pg   MCHC 32.3 30.0 - 36.0 g/dL   RDW 85.5 88.4 - 84.4 %   Platelets 428 (H) 150 - 400 K/uL   nRBC 0.0 0.0 - 0.2 %    Comment: Performed at [facility],  [address] [place], [place] [address]    DG Chest Portable 1 View Result Date: 01/02/2024 EXAM: 1 VIEW(S) XRAY OF THE CHEST 01/02/2024 07:59:00 AM COMPARISON: 01/01/2024 CLINICAL HISTORY: ptx recheck FINDINGS: LINES, TUBES AND DEVICES: Left chest tube stable in position. LUNGS AND PLEURA: Small left apical pneumothorax, stable. Subsegmental atelectasis at left lung base. No pleural effusion. HEART AND MEDIASTINUM: No acute abnormality of the cardiac and mediastinal silhouettes. BONES AND SOFT TISSUES: Trace left chest wall subcutaneous emphysema, stable. No acute osseous abnormality. IMPRESSION: 1. Stable small left apical pneumothorax with left chest tube in place. 2. Subsegmental atelectasis at the left lung base. 3. Trace left chest wall subcutaneous emphysema. Electronically signed by: Waddell Calk MD 01/02/2024 08:15 AM EST RP Workstation:  [id number]   DG Chest Port 1 View Result Date: 01/01/2024 EXAM: 1 VIEW(S) XRAY OF THE CHEST 01/01/2024 11:48:00 PM COMPARISON: 01/01/2024 CLINICAL HISTORY: chest tube placement FINDINGS: LINES, TUBES AND DEVICES: Left chest pigtail catheter placement. LUNGS AND PLEURA: No focal pulmonary opacity. No pleural effusion. Trace residual pneumothorax within the costophrenic sulcus. HEART AND MEDIASTINUM: No acute abnormality of the cardiac and mediastinal silhouettes. BONES AND SOFT TISSUES: No acute osseous abnormality. Trace left chest wall subcutaneous emphysema. IMPRESSION: 1. Left chest pigtail catheter placement. 2. Trace residual pneumothorax within the costophrenic sulcus. 3. Trace left chest wall subcutaneous emphysema. Electronically signed by: Dorethia Molt MD 01/01/2024 11:52 PM EST RP Workstation: [id number]   CT Angio Chest PE W and/or Wo Contrast Result Date: 01/01/2024 CLINICAL DATA:  High probability for PE.  Pneumothorax. EXAM: CT ANGIOGRAPHY CHEST WITH CONTRAST TECHNIQUE: Multidetector CT imaging of the chest was performed using the standard  protocol during bolus administration of intravenous contrast. Multiplanar CT image reconstructions and MIPs were obtained to evaluate the vascular anatomy. RADIATION DOSE REDUCTION: This exam was performed according to the departmental dose-optimization program which includes automated exposure control, adjustment of the mA and/or kV according to patient size and/or use of iterative reconstruction technique. CONTRAST:  75mL OMNIPAQUE  IOHEXOL  350 MG/ML SOLN COMPARISON:  Chest x-ray same day.  CT chest 01/06/2017. FINDINGS: Cardiovascular: Satisfactory opacification of the pulmonary arteries to the segmental level. No evidence of pulmonary embolism. Normal heart size. No pericardial effusion. Mediastinum/Nodes: No enlarged mediastinal, hilar, or axillary lymph nodes. Thyroid gland, trachea, and esophagus demonstrate no significant findings. Lungs/Pleura: Moderate-sized left pneumothorax is present (proximally 40%. There is atelectasis of the left lower lobe and lingula. Mild emphysema present throughout both lungs. No other focal lung infiltrate or pleural effusion. Upper Abdomen: No acute abnormality.  Gallbladder surgically absent. Musculoskeletal: No chest wall abnormality. No acute or significant osseous findings. Review of the MIP images confirms the above findings. IMPRESSION: 1. No evidence for pulmonary embolism. 2. Moderate-sized left pneumothorax (proximally 40%). 3. Atelectasis of the left lower lobe and lingula. 4. Emphysema. Emphysema (ICD10-J43.9). Electronically Signed   By: Greig Pique M.D.   On: 01/01/2024 22:56   DG Chest 2 View Result Date: 01/01/2024 CLINICAL DATA:  Shortness of breath. EXAM: DG CHEST 2V COMPARISON:  Chest radiograph dated 03/31/2021. FINDINGS: Background of emphysema. There is a moderate size left pneumothorax, proximally 20%. No pleural effusion. The cardiac silhouette is within normal limits. No acute osseous pathology. IMPRESSION: Moderate size left pneumothorax. These  results were called by telephone at the time of interpretation on 01/01/2024 at 9:05 pm to provider DAVID YAO , who verbally acknowledged these results. Electronically Signed   By: Vanetta Chou M.D.   On: 01/01/2024 21:16   Review of Systems  Constitutional:  Positive for malaise/fatigue. Negative for chills and fever.  HENT:  Positive for congestion.   Respiratory:  Positive for shortness of breath.   Cardiovascular:  Positive for chest pain and palpitations.  Gastrointestinal:  Positive for heartburn.  Psychiatric/Behavioral:  The patient is nervous/anxious.     Blood pressure 92/72, pulse 74, temperature 97.8 F (36.6 C), temperature source Oral, resp. rate 15, weight 51.3 kg, SpO2 97%. Physical Exam Vitals reviewed.  Constitutional:      General: She is not in acute distress.    Appearance: She is ill-appearing. She is not toxic-appearing.  HENT:     Head: Normocephalic and atraumatic.     Mouth/Throat:     Pharynx: No pharyngeal swelling  or oropharyngeal exudate.  Eyes:     Extraocular Movements: Extraocular movements intact.     Pupils: Pupils are equal, round, and reactive to light.  Neck:     Thyroid: No thyromegaly.  Cardiovascular:     Rate and Rhythm: Normal rate and regular rhythm.     Pulses: Normal pulses.     Heart sounds: No murmur heard. Pulmonary:     Breath sounds: Decreased breath sounds present.     Comments: Coarse BS Chest:     Chest wall: No mass, deformity or tenderness.  Abdominal:     General: Bowel sounds are normal.     Palpations: Abdomen is soft. There is no mass.     Tenderness: There is no guarding or rebound.  Musculoskeletal:     Cervical back: Normal range of motion.     Right lower leg: No edema.     Left lower leg: No edema.  Lymphadenopathy:     Cervical: No cervical adenopathy.  Skin:    General: Skin is warm and dry.     Coloration: Skin is not cyanotic.  Neurological:     General: No focal deficit present.     Mental  Status: She is alert.  Psychiatric:        Mood and Affect: Mood is anxious.     Assessment/Plan: Spontaneous left pneumothorax in the setting of significant emphysema/COPD Continue tube to suction We will continue to follow with you Hoping to avoid surgery if possible  Lemond FORBES Cera PA-C 01/03/2024, 11:21 AM    Chart reviewed, patient examined, agree with above.  She is having a lot of pain from her back around to the front in a segmental distribution that is probably intercostal nerve pain from the tube. Treat with pain meds as needed. There is no air leak and tiny residual apical ptx or bleb. Will continue to suction today and if no change will put to water seal in am.

## 2024-01-04 ENCOUNTER — Inpatient Hospital Stay (HOSPITAL_COMMUNITY): Payer: PRIVATE HEALTH INSURANCE

## 2024-01-04 DIAGNOSIS — J939 Pneumothorax, unspecified: Secondary | ICD-10-CM | POA: Diagnosis not present

## 2024-01-04 MED ORDER — HYDROMORPHONE HCL 1 MG/ML IJ SOLN
1.0000 mg | INTRAMUSCULAR | Status: DC | PRN
  Administered 2024-01-04 – 2024-01-05 (×6): 2 mg via INTRAVENOUS
  Filled 2024-01-04 (×7): qty 2

## 2024-01-04 MED ORDER — OXYCODONE HCL ER 10 MG PO T12A
10.0000 mg | EXTENDED_RELEASE_TABLET | Freq: Two times a day (BID) | ORAL | Status: DC
  Administered 2024-01-04 – 2024-01-05 (×3): 10 mg via ORAL
  Filled 2024-01-04 (×3): qty 1

## 2024-01-04 NOTE — Progress Notes (Signed)
 Jessica Wolf  FMW:[id number] DOB: [birth year] DOA: 01/01/2024 PCP: Bobbette Coye LABOR, MD    Brief Narrative:  48 year old with a history of COPD on as needed home O2 who presented to the [facility] ER 11/20 with a 5-day history of worsening shortness of breath.  She was initially seen at a local urgent care center 2 days prior at which time the initial read of her chest x-ray was unremarkable.  Her x-ray was over read on the date of her presentation at which time mention was made of a pneumothorax and as a result the patient was contacted and directed to the ED.  In the ER CTa of the chest was obtained which ruled out pulmonary embolism but did note a 40% left pneumothorax with atelectasis of the left lower lobe and lingula.  A pigtail chest tube was inserted by the EDP 11/20.  The patient was subsequently transferred to [facility] to be followed by the TCTS.  Goals of Care:   Code Status: Full Code   DVT prophylaxis: enoxaparin  (LOVENOX ) injection 40 mg Start: 01/02/24 2000   Interim Hx: The patient has complained of severe unrelenting chest wall pain overnight.  She has not been eating or sleeping as a result.  Her vitals have been stable.  Her respiratory status has been stable.  At the time of my visit she has no other acute complaints.  She is alert and conversant.  She is anxious to have her chest tube removed.  Assessment & Plan:  Spontaneous left pneumothorax In setting of underlying COPD - pigtail chest tube placed in ED 11/20 - chest tube management per TCTS with patient progressing well -hopeful for discontinuation of chest tube tomorrow  Pain control Control of chest wall pain is proving difficult in this patient who is on chronic Suboxone -add scheduled long-acting narcotic today and monitor -patient has been counseled that is unlikely will be able to completely resolve her pain but that we will certainly do our best to minimize it  COPD Continue usual inhaled  medications - no acute exacerbation at present   Family Communication: No family present at time of exam Disposition: Anticipate discharge home when chest tube able to be removed -possibly as early as 01/05/2024   Objective: Blood pressure 93/70, pulse 82, temperature 97.7 F (36.5 C), temperature source Oral, resp. rate 17, weight 51.3 kg, SpO2 100%.  Intake/Output Summary (Last 24 hours) at 01/04/2024 1005 Last data filed at 01/04/2024 0500 Gross per 24 hour  Intake 560 ml  Output 305 ml  Net 255 ml   Filed Weights   01/01/24 2253  Weight: 51.3 kg    Examination: General: No acute respiratory distress Lungs: Clear to auscultation bilaterally without wheezes or crackles Cardiovascular: RRR without murmur gallop or rub Abdomen: NT/ND, soft, BS positive, no rebound Extremities: No significant cyanosis, clubbing, or edema bilateral lower extremities  CBC: Recent Labs  Lab 01/01/24 2126 01/02/24 1755 01/03/24 0330  WBC 9.9 11.4* 9.2  HGB 12.8 12.7 12.6  HCT 37.7 39.2 39.0  MCV 88.9 90.3 89.7  PLT 432* 451* 428*   Basic Metabolic Panel: Recent Labs  Lab 01/01/24 2126 01/02/24 1755 01/03/24 0330  NA 137 137 137  K 3.9 4.6 4.0  CL 99 96* 99  CO2 27 30 32  GLUCOSE 98 86 91  BUN 25* 16 18  CREATININE 0.75 0.85 1.04*  CALCIUM 9.4 9.1 8.7*   GFR: CrCl cannot be calculated (Unknown ideal weight.).  Scheduled Meds:  enoxaparin  (LOVENOX ) injection  40 mg Subcutaneous Q24H   fluticasone  furoate-vilanterol  1 puff Inhalation Daily   ketorolac   15 mg Intravenous Q8H   oxyCODONE   10 mg Oral Q12H   pantoprazole   20 mg Oral Daily   polyethylene glycol  17 g Oral Daily   sodium chloride  flush  10 mL Intrapleural Q8H   sodium chloride  flush  3 mL Intravenous Q12H     LOS: 2 days   Reyes IVAR Moores, MD [facility] Office  [phone] Pager - Text Page per Tracey  If 7PM-7AM, please contact night-coverage per Amion 01/04/2024, 10:05 AM

## 2024-01-04 NOTE — Progress Notes (Addendum)
 Pt is alert and fully oriented x 4, afebrile, BP 90/60- 102/68 mmHg, HR 70s-80s, NSR, on NCL 1-2 LPM, SPO2 97-100%, normal respiratory effort, no SOB. Chest tube is on -20 cmH2O of wall suction. No active drainage, no air-leak. Pt has diminished breath sound on left lung on auscultations.   Pt refuses to eat due to unbearable pain which is her major complaints overnight . Her pain is constantly described as severe sharp pain on the left intercostal nerve near chest tube, pain scale 9-10/10. We manage her pain with Dilaudid , Oxycodone  and Toradol  as we documented on MAR.  MD is aware that the Pt has poor progression on pain controlled. Plan of care is reviewed. We will hand off to the day shift team and will continue to monitor.  Wendi Dash, RN

## 2024-01-04 NOTE — Progress Notes (Addendum)
  Subjective: Pain has been an issue but improving  Objective: Vital signs in last 24 hours: Temp:  [97.7 F (36.5 C)-97.8 F (36.6 C)] 97.7 F (36.5 C) (11/23 0500) Pulse Rate:  [70-87] 82 (11/23 0800) Cardiac Rhythm: Normal sinus rhythm (11/22 1954) Resp:  [12-24] 17 (11/23 0800) BP: (90-110)/(60-79) 93/70 (11/23 0800) SpO2:  [94 %-100 %] 100 % (11/23 0800)  Hemodynamic parameters for last 24 hours:    Intake/Output from previous day: 11/22 0701 - 11/23 0700 In: 570 [P.O.:550] Out: 305 [Urine:300; Chest Tube:5] Intake/Output this shift: No intake/output data recorded.  General appearance: alert, cooperative, and no distress Heart: regular rate and rhythm Lungs: clear to auscultation bilaterally  Lab Results: Recent Labs    01/02/24 1755 01/03/24 0330  WBC 11.4* 9.2  HGB 12.7 12.6  HCT 39.2 39.0  PLT 451* 428*   BMET:  Recent Labs    01/02/24 1755 01/03/24 0330  NA 137 137  K 4.6 4.0  CL 96* 99  CO2 30 32  GLUCOSE 86 91  BUN 16 18  CREATININE 0.85 1.04*  CALCIUM 9.1 8.7*    PT/INR:  Recent Labs    01/01/24 2126  LABPROT 13.1  INR 0.9   ABG No results found for: PHART, HCO3, TCO2, ACIDBASEDEF, O2SAT CBG (last 3)  No results for input(s): GLUCAP in the last 72 hours.  Meds Scheduled Meds:  enoxaparin  (LOVENOX ) injection  40 mg Subcutaneous Q24H   fluticasone  furoate-vilanterol  1 puff Inhalation Daily   ketorolac   15 mg Intravenous Q8H   oxyCODONE   10 mg Oral Q12H   pantoprazole   20 mg Oral Daily   polyethylene glycol  17 g Oral Daily   sodium chloride  flush  10 mL Intrapleural Q8H   sodium chloride  flush  3 mL Intravenous Q12H   Continuous Infusions: PRN Meds:.acetaminophen  **OR** [DISCONTINUED] acetaminophen , albuterol , diazepam , HYDROmorphone  (DILAUDID ) injection, ondansetron  (ZOFRAN ) IV, oxyCODONE   Xrays No results found.  Assessment/Plan:  1 afeb, VSS sinus rhythm 2 O2 sats good on 1 liter [place] 3 CT minimal drainage   4 CXR- no definative pntx to my read 5 will place to H2O seal, repeat CXR in am - if ok will d/c tube at that time    LOS: 2 days    Jessica FORBES Cera PA-C Pager [phone] 01/04/2024   Chart reviewed, patient examined, agree with above.  CXR looks fine. No air leak with cough. Plan water seal today and remove in am if CXR ok and no air leak. Pain should resolve once tube is out.

## 2024-01-05 ENCOUNTER — Other Ambulatory Visit (HOSPITAL_COMMUNITY): Payer: Self-pay

## 2024-01-05 ENCOUNTER — Inpatient Hospital Stay (HOSPITAL_COMMUNITY): Payer: PRIVATE HEALTH INSURANCE

## 2024-01-05 ENCOUNTER — Encounter: Payer: Self-pay | Admitting: Internal Medicine

## 2024-01-05 DIAGNOSIS — J9383 Other pneumothorax: Secondary | ICD-10-CM

## 2024-01-05 MED ORDER — OXYCODONE HCL ER 10 MG PO T12A
10.0000 mg | EXTENDED_RELEASE_TABLET | Freq: Two times a day (BID) | ORAL | 0 refills | Status: AC
  Filled 2024-01-05: qty 10, 5d supply, fill #0

## 2024-01-05 MED ORDER — OXYCODONE HCL 5 MG PO TABS
5.0000 mg | ORAL_TABLET | ORAL | 0 refills | Status: AC | PRN
  Filled 2024-01-05: qty 30, 7d supply, fill #0

## 2024-01-05 MED ORDER — ACETAMINOPHEN 325 MG PO TABS: 650.0000 mg | ORAL_TABLET | Freq: Four times a day (QID) | ORAL | Status: AC | PRN

## 2024-01-05 NOTE — Plan of Care (Addendum)
 Pt is alert and fully oriented x 4, afebrile, stable hemodynamically, NSR, on NCL 2 LPM, SPO2 97-100%, normal respiratory effort, no SOB. Chest tube is under water sealed. No active drainage, no air-leak. The chest tube is flushed q 8 hrs with NSS.  Pt has diminished breath sound on left lung on auscultations.   However, we get a good progress on the chest x-ray result today 01/05/24 shows left chest tube in place without evidence for pneumothorax.    Pt reports she is able to eat better than yesterday. Her major complaints overnight is constantly pain described as sharp pain on the left intercostal nerve near the chest tube site, pain scale 9-10/10. Her pain is tolerated by Dilaudid  PRN q 2 hrs for break through pain, Oxycodone  PRN q 4 hrs and scheduled extended- release Oxycodone  q 12 hrs and Toradol  q 8 hrs as we documented on MAR.  MD is aware that the Pt has slow progression on pain controlled. It seems like combining Valium  can help minimize her anxiety with pain. She is able to rest well in between the interventions.  Pt has constipation. Her last BM is on 12/31/23, which is 5 days ago. Pt reports negative flatulence. She has an order for Miralax   every day. We will hand off to the day shift team for more bowel regimens.   Plan of care is reviewed. Pt has been progressing. We will continue to monitor  Problem: Clinical Measurements: Goal: Ability to maintain clinical measurements within normal limits will improve Outcome: Progressing Goal: Will remain free from infection Outcome: Progressing Goal: Diagnostic test results will improve Outcome: Progressing Goal: Respiratory complications will improve Outcome: Progressing Goal: Cardiovascular complication will be avoided Outcome: Progressing   Problem: Nutrition: Goal: Adequate nutrition will be maintained Outcome: Progressing   Problem: Activity: Goal: Risk for activity intolerance will decrease Outcome: Progressing   Problem:  Coping: Goal: Level of anxiety will decrease Outcome: Progressing   Problem: Elimination: Goal: Will not experience complications related to bowel motility Outcome: Progressing Goal: Will not experience complications related to urinary retention Outcome: Progressing   Problem: Pain Managment: Goal: General experience of comfort will improve and/or be controlled Outcome: Progressing   Wendi Dash, RN

## 2024-01-05 NOTE — Discharge Instructions (Signed)
 Resume your usual suboxone dose 72 hours AFTER you take your last dose of oral pain medication as we discussed.

## 2024-01-05 NOTE — Plan of Care (Signed)
 Pt to go home per MD

## 2024-01-05 NOTE — Discharge Summary (Signed)
 DISCHARGE SUMMARY  Jessica Wolf  MR#: [id number]  DOB:1975-04-25  Date of Admission: 01/01/2024 Date of Discharge: 01/05/2024  Attending Physician:Svetlana Bagby ONEIDA Moores, MD  Patient's ERE:[name], [name], MD  Disposition: D/C home  Follow-up Appts:  Follow-up Information     Jessica Dorise POUR, MD Follow up.   Specialty: Cardiothoracic Surgery Why: Please see discharge paperwork for details of follow-up appointment with surgery office.  On the date you see them you will obtain a chest x-ray in the same office complex on the second floor 1/2-hour prior to the appointment. 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 She was initially seen at a local urgent care center 2 days prior at which time the initial read of her chest x-ray was unremarkable. Her x-ray was over read on the date of her presentation at which time mention was made of a pneumothorax and as a result the patient was contacted and directed to the ED. In the ER CTa of the chest was obtained which ruled out pulmonary embolism but did note a 40% left pneumothorax with atelectasis of the left lower lobe and lingula. A pigtail chest tube was inserted by the EDP 11/20. The patient was subsequently transferred to [facility] to be followed by the [facility].   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 all day long   Pain control Control of chest wall pain proved difficult in this patient who is on chronic Suboxone - added scheduled long-acting narcotic 11/23 -pain was improved with removal of chest tube -ultimately pain was best controlled with a combination of scheduled OxyContin  as well as oxycodone  as needed breakthrough pain -I had a lengthy discussion with the patient about her prior illicit substance abuse -she reported to me that she had abstained for many many years and was well-controlled with Suboxone -I explained to her that I did feel she would need narcotic therapy short-term to assist with management of her chest wall pain but that it would not be appropriate to prescribe this long-term -I discussed with her that we would use a 5-day course of OxyContin  on scheduled with a 7-day overlapping course of oxycodone  as needed for breakthrough pain -she is advised that after day 7 she should stop any further oxycodone  dosing and that she should then take a 72-hour narcotic free break after which she should resume her Suboxone as previously dosed -under no circumstances should she be provided with any further prescriptions for narcotics for this issue   COPD Continue usual inhaled medications - no acute exacerbation during this hospital stay -strongly advised to discontinue tobacco use permanently and educated on the fact that her COPD will only worsen if  she fails to do so  Allergies as of 01/05/2024       Reactions   Penicillins Shortness Of Breath   Morphine And Codeine Hives        Medication List     STOP taking these medications    buprenorphine-naloxone 2-0.5 mg Subl SL tablet Commonly known as: SUBOXONE   traMADol  50 MG tablet Commonly known as: ULTRAM        TAKE these medications    acetaminophen  325 MG  tablet Commonly known as: TYLENOL  Take 2 tablets (650 mg total) by mouth every 6 (six) hours as needed for mild pain (pain score 1-3) or fever (or Fever >/= 101). What changed:  medication strength how much to take reasons to take this   albuterol  108 (90 Base) MCG/ACT inhaler Commonly known as: VENTOLIN  HFA Inhale 2 puffs into the lungs every 4 (four) hours as needed for shortness of breath or wheezing.   doxycycline  100 MG capsule Commonly known as: VIBRAMYCIN  Take 1 capsule (100 mg total) by mouth 2 (two) times daily. One po bid x 7 days   fluticasone -salmeterol 250-50 MCG/ACT Aepb Commonly known as: ADVAIR Inhale 1 puff into the lungs 2 (two) times daily.   oxyCODONE  5 MG immediate release tablet Commonly known as: Oxy IR/ROXICODONE  Take 1-2 tablets (5-10 mg total) by mouth every 4 (four) hours as needed for up to 7 days for severe pain (pain score 7-10).   oxyCODONE  10 mg 12 hr tablet Commonly known as: OXYCONTIN  Take 1 tablet (10 mg total) by mouth every 12 (twelve) hours for 5 days.   pantoprazole  20 MG tablet Commonly known as: PROTONIX  Take 1 tablet (20 mg total) by mouth daily.   polyethylene glycol 17 g packet Commonly known as: MIRALAX  / GLYCOLAX  Take 17 g by mouth daily. What changed:  when to take this reasons to take this        Day of Discharge BP 107/72   Pulse 78   Temp 97.8 F (36.6 C) (Oral)   Resp 11   Ht 5' 4 (1.626 m)   Wt 51.3 kg   SpO2 100%   BMI 19.40 kg/m   Physical Exam: General: No acute respiratory distress Lungs: Clear to auscultation bilaterally without wheezes or crackles Cardiovascular: Regular rate and rhythm without murmur gallop or rub normal S1 and S2 Abdomen: Nontender, nondistended, soft, bowel sounds positive, no rebound, no ascites, no appreciable mass Extremities: No significant cyanosis, clubbing, or edema bilateral lower extremities  Basic Metabolic Panel: Recent Labs  Lab 01/01/24 2126 01/02/24 1755  01/03/24 0330  NA 137 137 137  K 3.9 4.6 4.0  CL 99 96* 99  CO2 27 30 32  GLUCOSE 98 86 91  BUN 25* 16 18  CREATININE 0.75 0.85 1.04*  CALCIUM 9.4 9.1 8.7*    CBC: Recent Labs  Lab 01/01/24 2126 01/02/24 1755 01/03/24 0330  WBC 9.9 11.4* 9.2  HGB 12.8 12.7 12.6  HCT 37.7 39.2 39.0  MCV 88.9 90.3 89.7  PLT 432* 451* 428*    Time spent in discharge (includes decision making & examination of pt): 35 minutes  01/05/2024, 2:30 PM   Reyes IVAR Moores, MD [facility] Office  [phone]

## 2024-01-05 NOTE — Progress Notes (Signed)
 Per order by Lemond Cera PA Chest tube to be removed. This RN along with Redell RN charge at bedside,order reviewed.  Pt educated regarding chest tube removal and educated with what would be done. Chest tube removed pt tolerated well and v/s's documented Q62min x4.

## 2024-01-05 NOTE — Progress Notes (Addendum)
 Subjective: Having a lot of pain  Objective: Vital signs in last 24 hours: Temp:  [97.8 F (36.6 C)-98.3 F (36.8 C)] 97.8 F (36.6 C) (11/24 0336) Pulse Rate:  [77-95] 93 (11/24 0336) Cardiac Rhythm: Normal sinus rhythm (11/23 1943) Resp:  [13-20] 20 (11/24 0336) BP: (93-113)/(60-85) 113/82 (11/24 0336) SpO2:  [66 %-100 %] 100 % (11/24 0336)  Hemodynamic parameters for last 24 hours:    Intake/Output from previous day: 11/23 0701 - 11/24 0700 In: 280 [P.O.:250] Out: 922 [Urine:900; Chest Tube:22] Intake/Output this shift: No intake/output data recorded.  General appearance: alert, cooperative, distracted, and mild distress Heart: regular rate and rhythm Lungs: clear to auscultation bilaterally  Lab Results: Recent Labs    01/02/24 1755 01/03/24 0330  WBC 11.4* 9.2  HGB 12.7 12.6  HCT 39.2 39.0  PLT 451* 428*   BMET:  Recent Labs    01/02/24 1755 01/03/24 0330  NA 137 137  K 4.6 4.0  CL 96* 99  CO2 30 32  GLUCOSE 86 91  BUN 16 18  CREATININE 0.85 1.04*  CALCIUM 9.1 8.7*    PT/INR: No results for input(s): LABPROT, INR in the last 72 hours. ABG No results found for: PHART, HCO3, TCO2, ACIDBASEDEF, O2SAT CBG (last 3)  No results for input(s): GLUCAP in the last 72 hours.  Meds Scheduled Meds:  enoxaparin  (LOVENOX ) injection  40 mg Subcutaneous Q24H   fluticasone  furoate-vilanterol  1 puff Inhalation Daily   ketorolac   15 mg Intravenous Q8H   oxyCODONE   10 mg Oral Q12H   pantoprazole   20 mg Oral Daily   polyethylene glycol  17 g Oral Daily   sodium chloride  flush  10 mL Intrapleural Q8H   sodium chloride  flush  3 mL Intravenous Q12H   Continuous Infusions: PRN Meds:.acetaminophen  **OR** [DISCONTINUED] acetaminophen , albuterol , diazepam , HYDROmorphone  (DILAUDID ) injection, ondansetron  (ZOFRAN ) IV, oxyCODONE   Xrays DG Chest 1 View Result Date: 01/05/2024 CLINICAL DATA:  Pneumothorax.  Chest pain EXAM: CHEST  1 VIEW COMPARISON:   01/04/2024 FINDINGS: Left chest tube again noted. No evidence for left-sided pneumothorax. Minimal basilar atelectasis. The lungs are otherwise clear without focal pneumonia, edema, pneumothorax or pleural effusion. The cardiopericardial silhouette is within normal limits for size. IMPRESSION: Left chest tube in place without evidence for pneumothorax. Electronically Signed   By: Camellia Candle M.D.   On: 01/05/2024 06:21   DG Chest 1 View Result Date: 01/04/2024 EXAM: 1 VIEW(S) XRAY OF THE CHEST 01/04/2024 07:38:00 AM COMPARISON: 01/02/2024 CLINICAL HISTORY: Pneumothorax on left. FINDINGS: LINES, TUBES AND DEVICES: Stable left chest tube in place. LUNGS AND PLEURA: Tiny residual left apical pneumothorax, decreased in size compared to previous study. No focal pulmonary opacity. No pleural effusion. HEART AND MEDIASTINUM: No acute abnormality of the cardiac and mediastinal silhouettes. BONES AND SOFT TISSUES: No acute osseous abnormality. IMPRESSION: 1. Tiny residual left apical pneumothorax, decreased in size compared with prior study. 2. Left chest tube remains in stable position. Electronically signed by: Lonni Necessary MD 01/04/2024 11:28 AM EST RP Workstation: [id number]    Assessment/Plan:  1 afeb, VSS 2 O2 sats good on 2 liters 3 CT minimal drainage, no air leak 4 CXR- no pntx 5 cont pain control and med management per primary 6 d/c Chest tube today , repeat CXR 7 discharge when felt to be medically stable   Addendum : CXR following tube removal looks stable   LOS: 3 days    Lemond FORBES Cera PA-C Pager [phone] 01/05/2024  Patient seen  and examined, agree with above  Elspeth C. Kerrin, MD [facility] [phone]

## 2024-01-07 ENCOUNTER — Other Ambulatory Visit (HOSPITAL_BASED_OUTPATIENT_CLINIC_OR_DEPARTMENT_OTHER): Payer: Self-pay

## 2024-01-07 ENCOUNTER — Other Ambulatory Visit (HOSPITAL_COMMUNITY): Payer: Self-pay

## 2024-01-07 MED FILL — Hydromorphone HCl Inj 2 MG/ML: INTRAMUSCULAR | Qty: 1 | Status: AC

## 2024-01-20 ENCOUNTER — Other Ambulatory Visit: Payer: Self-pay | Admitting: Surgery

## 2024-01-20 DIAGNOSIS — J9311 Primary spontaneous pneumothorax: Secondary | ICD-10-CM

## 2024-01-21 ENCOUNTER — Ambulatory Visit: Payer: PRIVATE HEALTH INSURANCE

## 2024-01-21 ENCOUNTER — Other Ambulatory Visit (HOSPITAL_COMMUNITY): Payer: PRIVATE HEALTH INSURANCE

## 2024-01-22 NOTE — Progress Notes (Signed)
 No show
